# Patient Record
Sex: Male | Born: 1960
Health system: Southern US, Community
[De-identification: ages and names within clinical notes are randomized; demographics above are authoritative.]

## PROBLEM LIST (undated history)

## (undated) DIAGNOSIS — M5126 Other intervertebral disc displacement, lumbar region: Secondary | ICD-10-CM

## (undated) DIAGNOSIS — E7800 Pure hypercholesterolemia, unspecified: Secondary | ICD-10-CM

## (undated) DIAGNOSIS — I10 Essential (primary) hypertension: Secondary | ICD-10-CM

## (undated) DIAGNOSIS — N200 Calculus of kidney: Secondary | ICD-10-CM

## (undated) DIAGNOSIS — IMO0002 Reserved for concepts with insufficient information to code with codable children: Secondary | ICD-10-CM

## (undated) DIAGNOSIS — E119 Type 2 diabetes mellitus without complications: Secondary | ICD-10-CM

## (undated) HISTORY — PX: NECK SURGERY: SHX720

## (undated) HISTORY — PX: BACK SURGERY: SHX140

---

## 2003-05-06 ENCOUNTER — Encounter: Payer: Self-pay | Admitting: Neurosurgery

## 2003-05-08 ENCOUNTER — Encounter: Payer: Self-pay | Admitting: Neurosurgery

## 2003-05-08 ENCOUNTER — Ambulatory Visit (HOSPITAL_COMMUNITY): Admission: RE | Admit: 2003-05-08 | Discharge: 2003-05-09 | Payer: Self-pay | Admitting: Neurosurgery

## 2003-10-02 ENCOUNTER — Inpatient Hospital Stay (HOSPITAL_COMMUNITY): Admission: RE | Admit: 2003-10-02 | Discharge: 2003-10-03 | Payer: Self-pay | Admitting: Neurosurgery

## 2004-06-16 ENCOUNTER — Encounter: Admission: RE | Admit: 2004-06-16 | Discharge: 2004-06-16 | Payer: Self-pay | Admitting: Neurosurgery

## 2004-07-20 ENCOUNTER — Encounter: Admission: RE | Admit: 2004-07-20 | Discharge: 2004-07-20 | Payer: Self-pay | Admitting: Neurosurgery

## 2004-08-03 ENCOUNTER — Encounter: Admission: RE | Admit: 2004-08-03 | Discharge: 2004-08-03 | Payer: Self-pay | Admitting: Neurosurgery

## 2004-10-12 ENCOUNTER — Encounter: Admission: RE | Admit: 2004-10-12 | Discharge: 2004-10-12 | Payer: Self-pay | Admitting: Neurosurgery

## 2004-11-28 ENCOUNTER — Encounter (INDEPENDENT_AMBULATORY_CARE_PROVIDER_SITE_OTHER): Payer: Self-pay | Admitting: Specialist

## 2004-11-28 ENCOUNTER — Inpatient Hospital Stay (HOSPITAL_COMMUNITY): Admission: RE | Admit: 2004-11-28 | Discharge: 2004-12-02 | Payer: Self-pay | Admitting: Neurosurgery

## 2004-12-29 ENCOUNTER — Encounter: Admission: RE | Admit: 2004-12-29 | Discharge: 2004-12-29 | Payer: Self-pay | Admitting: Neurosurgery

## 2005-03-01 ENCOUNTER — Encounter: Admission: RE | Admit: 2005-03-01 | Discharge: 2005-03-01 | Payer: Self-pay | Admitting: Neurosurgery

## 2007-06-29 ENCOUNTER — Encounter: Admission: RE | Admit: 2007-06-29 | Discharge: 2007-06-29 | Payer: Self-pay | Admitting: Family Medicine

## 2007-07-29 ENCOUNTER — Ambulatory Visit (HOSPITAL_COMMUNITY): Admission: RE | Admit: 2007-07-29 | Discharge: 2007-07-30 | Payer: Self-pay | Admitting: Neurosurgery

## 2007-09-18 ENCOUNTER — Encounter: Admission: RE | Admit: 2007-09-18 | Discharge: 2007-09-18 | Payer: Self-pay | Admitting: Neurosurgery

## 2010-10-01 ENCOUNTER — Encounter: Payer: Self-pay | Admitting: Neurosurgery

## 2011-01-24 NOTE — Op Note (Signed)
NAME:  Stephen Moyer, Stephen Moyer                ACCOUNT NO.:  [id number]   MEDICAL RECORD NO.:  [id number]          PATIENT TYPE:  AMB   LOCATION:  SDS                          FACILITY:  [facility]   PHYSICIAN:  Henry A. Pool, M.D.    DATE OF BIRTH:  Sep 02, 1961   DATE OF PROCEDURE:  07/29/2007  DATE OF DISCHARGE:                               OPERATIVE REPORT   SERVICE:  Neurosurgery.   DIAGNOSIS:  C5-6 stenosis with myelopathy and radiculopathy.   POSTOPERATIVE DIAGNOSIS:  C5-6 stenosis with myelopathy and  radiculopathy.   PROCEDURE NAME:  C5-6 anterior cervical diskectomy and fusion with  allograft anterior plating.   SURGEON:  Kathaleen Maser. Pool, M.D.   ASSISTANT:  Donalee Citrin, M.D.   ANESTHESIA:  General endotracheal.   INDICATIONS:  Stephen Moyer is a 50 year old male with a history of neck  and right upper extremity pain, paresthesias and weakness consistent  with a right-sided C6 radiculopathy as well as some elements of an  underlying cervical myelopathy as well.  Workup has demonstrated  evidence of a central disk herniation at C5-6 with spinal cord  compression and exiting nerve root compression bilaterally.  The patient  has been counseled as to his options.  He has decided to proceed with a  C5-6 anterior cervical diskectomy and fusion with allograft and anterior  plating in hopes of improving his symptoms.   OPERATIVE NOTE:  The patient was taken to the operating room and placed  on the operating table in supine position.  After an adequate level of  anesthesia was achieved, the patient was positioned supine with his neck  slightly extended and held in place with halter traction.  The patient's  anterior cervical region is prepped and draped sterilely.  A 10 blade is  used to make a linear skin incision over the C5-6 interspace.  This is  carried down sharply to the platysma.  The platysma is then divided  vertically and dissection proceeds along the medial border of the  sternocleidomastoid muscle and carotid sheath.  Trachea and esophagus  are mobilized and retracted towards the left.  Prevertebral fascia is  stripped off the anterior spinal column.  The longus colli muscle is  elevated bilaterally using electrocautery.  A deep self-retaining  retractor was placed.  Intraoperative fluoroscopy was used.  The C5-6  level was confirmed.  Disk space at C5-6 was then incised with a 15  blade in a rectangular fashion.  A wide disk space clean-out was then  achieved using pituitary rongeurs, forward and backward-angled Carlen  curettes, Kerrison rongeurs, and a high-speed drill.  All elements of  the disk were removed down to the level of the posterior annulus.  A  microscope was then brought into the field and was used throughout the  remainder of the diskectomy and the remaining aspects of the annulus and  osteophytes were removed using a high-speed drill down to the level of  the posterior longitudinal ligament.  The posterior longitudinal  ligament was elevated and resected in piecemeal fashion using Kerrison  rongeurs.  The underlying thecal sac was  then identified.  A wide  central decompression was then performed by undercutting the bodies of  C5 and C6.  Decompression then proceeded out each neural foramen.  Wide  anterior foraminotomies were then performed along the course of the  exiting C6 nerve root bilaterally.  At this point a very thorough  decompression had been achieved.  There was no evidence of injury to the  thecal sac or nerve roots.  The wound was irrigated with antibiotic  solution.  Gelfoam was placed topically for hemostasis and found be  good.  A 6-mm LifeNet allograft wedge was then impacted into place,  recessed approximately 1 mm from the anterior cortical margin.  A 25-mm  Atlantis anterior cervical plate was then placed over the C5 and C6  levels.  This was then attached under fluoroscopic guidance using 13-mm  variable-angled  screws, two each at both levels.  All four screws were  given a final tightening and found to be solidly within bone.  The  locking screws were engaged at both levels.  Final images revealed good  position of bone grafts and hardware, proper operative level and normal  alignment of the spine.  The wound was then irrigated with antibiotic  solution.  Hemostasis was ensured with bipolar electrocautery.  The  wound was then closed in layers.  Steri-Strips and a sterile dressing  were applied.  There were no apparent complications.  The patient  tolerated the procedure well and he returns to the recovery room postop.           ______________________________  Kathaleen Maser. Pool, M.D.     HAP/MEDQ  D:  07/29/2007  T:  07/30/2007  Job:  [id number]

## 2011-01-27 NOTE — Op Note (Signed)
NAME:  Stephen Moyer                          ACCOUNT NO.:  [id number]   MEDICAL RECORD NO.:  [id number]                   PATIENT TYPE:  INP   LOCATION:  3012                                 FACILITY:  [facility]   PHYSICIAN:  Coletta Memos, M.D.                  DATE OF BIRTH:  [birth year]   DATE OF PROCEDURE:  10/02/2003  DATE OF DISCHARGE:                                 OPERATIVE REPORT   PREOPERATIVE DIAGNOSES:  1. Lumbar stenosis, L4-5, L3-4.  2. Possible disk recurrence L3-4.   POSTOPERATIVE DIAGNOSES:  1. Severe lumbar stenosis at L3-4, L4-5.  2. Scar formation L3-4.   SURGEON:  Coletta Memos, M.D.   ASSISTANT:  Hewitt Shorts, M.D.   PROCEDURE:  L3 hemilaminectomy, L4 laminectomy with microdissection  techniques.   COMPLICATIONS:  None.   FINDINGS:  1. Severe congenital stenosis of the spinal canal.  2. Exuberant scar formation around disk resection on the right at L3-4.  3. Very loose L3-4 joints, although the pars was not bruised, nor was the     facet capsule breached at either L4 or 4-5.   INDICATIONS:  Stephen Moyer is a 50 year old on whom I did an L3-4 disk  herniation.  He had lumbar radiculopathy.  Because he presented with  weakness in the lower extremities, I went ahead and he had his operation on  April 15, 2003, where he underwent a right L3-4 where he underwent a right  L3-4 semihemilaminectomy diskectomy.  Postoperatively he did well for nine  weeks.  In the tenth week he started to develop a little bit of pain in his  right leg again and that continued to progress.  New MRI showed what was  read as a recurrent disk although I disagreed.  I had a myelogram performed  and it did not show recurrent disk herniation but it did show pretty severe  stenosis at L3-4 and L4-5 and some sort of lump at the disk space.  I  therefore elected to take him back to the operating room for decompression.   OPERATIVE NOTE:  [name] was brought to the operating room,  intubated,  placed under general anesthesia without difficulty.  Rolled prone onto the  Wilson frame and all pressure points were properly padded.  The skin was  prepped and he was draped in a sterile fashion.  I infiltrated 20 mL 0.5%  lidocaine 1:200,000 strength epinephrine into the lumbar region using my old  incision as the guide.  I opened up the old incision and extended it  slightly caudally.  I then exposed the lamina of L3, L4 and L5.  I first  tried to do semihemilaminectomies, but the space was far too tight, and I  therefore elected to proceed with complete laminectomies of L4 and  hemilaminectomies of L3 and L5.  There was an opening in the dura made but  there  was no spinal fluid leakage.  The arachnoid was intact, and I just let  the scar which was overlying this remain in place.  Using the Kerrison  punches and the high speed air drill, I removed bone and did a decompression  of the L3-4 and L4-5 disk spaces.  Dr. Newell Coral assisted.   We brought the microscope into position and did not find a recurrent disk  herniation at L3-4 but did find exuberant scar formation in and around the  nerve roots.  There were clearish lumps and just significant scar tissue at  that level.  Disk material was removed, but the disk space was almost empty.  I controlled some bleeding in the epidural space with bipolar cautery.  I  also used some bone wax to control some bone bleeding.  I felt the patient  did have loose joints at L3-4 but the pars was intact and the joints were  intact.  We had not discussed a fusion and I did not feel it appropriate to  have him undergo fusion when 1) It was not entirely necessary, and 2) He did  not complain of back pain preoperatively nor at any other time.  His main  complaint was leg pain.   I then irrigated the wound.  I then closed the wound in layered fashion  using Vicryl sutures.  BioGlue was used to seal the scar tissue to the dura  overlying the  arachnoid.  Again no spinal fluid was ever encountered.  Sterile dressing was applied, being Steri-Strips and Telfa along with 4x4s.  The patient tolerated the procedure without difficulty.                                               Coletta Memos, M.D.    KC/MEDQ  D:  10/02/2003  T:  10/03/2003  Job:  [id number]

## 2011-01-27 NOTE — Op Note (Signed)
   NAME:  Stephen Moyer, Stephen Moyer                          ACCOUNT NO.:  [id number]   MEDICAL RECORD NO.:  [id number]                   PATIENT TYPE:  OIB   LOCATION:  2894                                 FACILITY:  [facility]   PHYSICIAN:  Coletta Memos, M.D.                  DATE OF BIRTH:  Jul 28, 1961   DATE OF PROCEDURE:  05/08/2003  DATE OF DISCHARGE:                                 OPERATIVE REPORT   PREOPERATIVE DIAGNOSIS:  Displaced disk, right L3-4, lumbar stenosis L3-4.   POSTOPERATIVE DIAGNOSIS:  Displaced disk, right L3-4, lumbar  stenosis L3-4,  L4 radiculopathy.   PROCEDURE:  1. L3 hemilaminectomy.  2. Right L3-4 diskectomy with microdissection.  3. Right L4 foraminotomy.   COMPLICATIONS:  None.   SURGEON:  Coletta Memos, M.D.   ANESTHESIA:  General endotracheal anesthesia.   INDICATIONS FOR PROCEDURE:  [name] is a 50 year old who presented  with weakness in his quadriceps muscle on the right side. Secondary to the  weakness on that side and MRI findings consistent with compression of the  nerve root, I recommended and he agreed to undergo a lumbar laminectomy and  diskectomy.   DESCRIPTION OF PROCEDURE:  [name] was brought to the operating room,  intubated and placed under general anesthesia without difficulty. His back  was prepped and he was draped in a sterile fashion. Using a preoperative  localizer, I opened the skin  and took this down to the thoracolumbar  fascia. I exposed the lamina of L3 and L4 on the right side.   After using a drill to perform a semi hemilaminectomy of L3, I felt that the  space was just too tight to try to proceed in that fashion. I then  exposed  the left lamina of L3 and L4 and proceeded with a hemilaminectomy of L3 and  L4.   After decompressing the spinal canal, I then proceeded to deal with the disk  herniation at L3-4. This was done with microdissection by retracting the L4  root medially. I then removed a significant amount  of disk material and  decompressed both the lateral aspect of the L3-4 neuroforamen and the  foraminotomy of L4 inferiorly.   I controlled some bleeding with bipolar cautery. I then irrigated the wound.  I then closed the wound in a layered fashion using Vicryl sutures. The skin  was reapproximated with Vicryl sutures. Dermabond was used for a sterile  dressing.                                                  Coletta Memos, M.D.    KC/MEDQ  D:  05/08/2003  T:  05/09/2003  Job:  [id number]

## 2011-01-27 NOTE — Op Note (Signed)
NAME:  Stephen Moyer, Stephen Moyer NO.:  [id number]   MEDICAL RECORD NO.:  [id number]          PATIENT TYPE:  INP   LOCATION:  2899                         FACILITY:  [facility]   PHYSICIAN:  Kathaleen Maser. Pool, M.D.    DATE OF BIRTH:  1961-04-04   DATE OF PROCEDURE:  11/28/2004  DATE OF DISCHARGE:                                 OPERATIVE REPORT   PREOPERATIVE DIAGNOSIS:  L3-4 degenerative disk disease with instability and  stenosis, status post previous L3-4 decompressive laminectomy.  L4-5  degenerative disk disease and stenosis, status post previous L4-5  decompressive laminectomy.  L3 post traumatic spondylolysis with  spondylolisthesis.   POSTOPERATIVE DIAGNOSES:  L3-4 degenerative disk disease with instability  and stenosis, status post previous L3-4 decompressive laminectomy.  L4-5  degenerative disk disease and stenosis, status post previous L4-5  decompressive laminectomy.  L3 post traumatic spondylolysis with  spondylolisthesis.   OPERATION PERFORMED:  L3 Gill procedure.  Re-exploration of L3-4 and L4-5  laminectomies with complete laminectomies and foraminotomies.  L3-4 and L4-5  posterior lumbar interbody fusion utilizing tangent wedges and local  autografting.  L3-4 and L4-5 posterolateral arthrodesis utilizing segmental  pedicle screw instrumentation and intertransverse bone grafting.   SURGEON:  Kathaleen Maser. Pool, M.D.   ASSISTANT:  Donalee Citrin, M.D.   ANESTHESIA:  General oral endotracheal.   INDICATIONS FOR PROCEDURE:  Stephen Moyer is a 50 year old male who is status  post previous L3-4 and L4-5 decompressive laminectomy with a right-sided L3-  4 microdiskectomy done on two different occasions.  The patient presents now  with severe back and left lower extremity pain consistent with a left-sided  L3 radiculopathy.  Work-up has demonstrated evidence of marked degenerative  disk disease with residual stenosis at L3-4 and L4-5.  The patient also has  evidence of  spondylosis with early spondylolisthesis at L3.  The patient has  marked reproduction of his symptoms with provocative diskography.  The  patient presents now for two-level decompression and fusion with  instrumentation.   DESCRIPTION OF PROCEDURE:  The patient was taken to the operating room and  placed on the table in the supine position.  After adequate level of  anesthesia was achieved, the patient was positioned prone onto a Wilson  frame and appropriately padded.  The patient's lumbar region was prepped and  draped sterilely.  A 10 blade was used to make a linear skin incision  overlying the L2, 3, 4, and 5  levels.  This was carried down sharply in the  midline.  A subperiosteal dissection was then performed exposing the lamina  and facet joints of L2 and L3 as well as the remaining facet joints at L3-4  and L4-5.  Transverse processes of L3, L4 and L5 were then dissected free.  Deep self-retaining retractor was placed.  Intraoperative fluoroscopy was  used and the levels were confirmed.  Residual lamina at L3 was removed using  the high speed drill and Kerrison rongeurs.  The patient's previous  decompressive laminectomy was dissected free and the surrounding epidural  scar was freed up.  Complete L3 laminectomy  was performed.  It should be  noted that patient had evidence of a post laminectomy spondylolysis of L3.  Complete inferior facetectomies at L3 were performed.  All bone was cleaned  and used in later autografting throughout.  Superior facetectomies at L4  were performed.  The underlying thecal sac and exiting L3-L4 nerve roots  were identified.  A wide decompressive foraminotomy was then performed along  the exiting nerve roots.  Decompression then proceeded inferiorly and  inferior facetectomies at L4 were then performed bilaterally as were  superior facetectomies at L5.  Once again, epidural scar was gently  dissected free.  In the midline, there was a large epidural  mass which was  black and quite thickened in nature.  This was causing severe dorsal  compression of the thecal sac.  This was dissected free.  This seemed  consistent with residual bioglue with a resultant foreign body reaction  around it.  There was no evidence of infection.  There was no evidence of  underlying CSF leak for which it was initially placed by the previous  surgeon.  The bioglue was sent off to pathology for analysis.  In the course  of freeing up the surrounding scar there was a small midline durotomy which  was made which was then oversewn with 5-0 Prolene.  Epidural venous plexus  was coagulated and cut.  Starting first on the patient's right side, the  thecal sac and nerve roots were protected at the L3-4 level.  The disk space  was then isolated and incised with a 15 blade in rectangular fashion.  Wide  disk space cleanout was then achieved using pituitary rongeurs, upward  angled pituitary rongeurs and Epstein curettes.  The procedure was then  repeated on the contralateral side at L3-4 and then bilaterally at L4-5.  Returning to L3-4 on the right side, thecal sac and nerve roots were once  again protected.  Disk space was then distracted up to 10 mm.  A 10 mm  distractor was left in the patient's right side.  Thecal sac and nerve roots  were protected on the left side.  Disk space was then reamed with a 10 mm  tangent box cutter and then cut with 10 mm tangent chisel.  Soft tissues  were removed from the interspace.  A 10 x 26 mm tangent wedge was then  impacted into place and recessed approximately 3 mm from the posterior  cortical margin.  Distractors were removed from the patient's right side.  Thecal sac and nerve roots were once again protected on the right side.  Once again the disk space was reamed and then cut with 10 mm tangent  instruments.  The disk space further curettaged.  Morcellized autograft was packed in the interspace.  A second 10 x 26 mm tangent  wedge was then packed  into place and recessed approximately 3 mm from the posterior cortical  margin.  The procedure was then repeated at the L4-5 level, again without  complication.  Again using 10 x 26 mm tangent wedges bilaterally.  Pedicles  of L3, L4 and L5 were then identified using surface landmarks and  intraoperative fluoroscopy.  Superficial bone overlying the pedicles were  then removed using a high speed drill.  Each pedicle was then probed, using  pedicle awl.  Each pedicle awl tract was then probed and found to be solidly  within bone.  Each pedicle awl tract was then tapped with 5.25 mm screw tap.  Once  again, these screw tap holes were probed and found to be solidly within  bone.  6.75 x 50 mm Spiral 90 screws placed bilaterally at L3, 6.75 x 45 mm  Spiral 90 screws were placed bilaterally at L4 and L5.  All six screws had  good purchase and were well positioned.  Transverse processes of L3, 4 and 5  were then decorticated using a high speed drill.  Morcellized autograft was  packed posterolaterally for intertransverse fusion.  Short segment of  titanium rod was then contoured and placed over the screw heads at L3, 4,  and 5.  Locking caps were then placed over the screw heads.  Lockings caps  were then engaged with the construct under compression.  A transverse  connector was placed.  Duragen and Tisseeal fibrin glue and sealant was then  placed over the dural repair.  Gelfoam was also placed within the gutters.  A medium Hemovac drain was left in the epidural space.  The wound was then  closed in layers with Vicryl sutures.  Steri-Strips and sterile dressing  were applied.  There were no apparent complications.  The patient tolerated  the procedure well and he returned to the recovery room postoperatively.      HAP/MEDQ  D:  11/28/2004  T:  11/28/2004  Job:  [id number]

## 2011-01-27 NOTE — Discharge Summary (Signed)
NAME:  Stephen Moyer, Stephen Moyer                          ACCOUNT NO.:  [id number]   MEDICAL RECORD NO.:  [id number]                   PATIENT TYPE:  INP   LOCATION:  3012                                 FACILITY:  [facility]   PHYSICIAN:  Coletta Memos, M.D.                  DATE OF BIRTH:  21-Sep-1960   DATE OF ADMISSION:  10/02/2003  DATE OF DISCHARGE:  10/03/2003                                 DISCHARGE SUMMARY   ADMISSION DIAGNOSES:  Lumbar stenosis L3-4, L4-5, possible recurrent disk L3-  4.   DISCHARGE DIAGNOSES:  Lumbar stenosis L4-5, L3-L4. Exuberant scar formation  though no disk recurrence identified.   DISPOSITION:  Discharge destination home.  Discharge status alive and well.  The wound was clean and dry without signs of infection at discharge. Normal  strength  in the lower extremities.   DISCHARGE MEDICATIONS:  1. Percocet 5 mg 1 to 2 q.6h., #60 given.  2. Flexeril 1 p.o. t.i.d. p.r.n. muscle spasms, 10 mg.   DISCHARGE INSTRUCTIONS:  At discharge the patient was given instructions to  wear a brace, Aspen type when up and out of bed for approximately 1 month.  He was also given instructions to follow up with my secretary to make an  appointment at my office in  3 to 4 weeks.   HOSPITAL COURSE:  The patient was admitted secondary to the questionable  possibility of a recurrent disk herniation at L3-4. She was brought to the  operating room and underwent  a lumbar laminectomy decompression at L3-4 and  L4-5 and exploration of disk space of L3-4 on the right side. He did not  have a recurrent disk but had a significant amount of scar formation.   Postoperatively he was doing well. He voided. He tolerated a regular diet  and has a normal neurologic examination.                                                Coletta Memos, M.D.    KC/MEDQ  D:  10/02/2003  T:  10/03/2003  Job:  [id number]

## 2011-01-27 NOTE — Discharge Summary (Signed)
NAME:  Stephen Moyer, Stephen Moyer NO.:  [id number]   MEDICAL RECORD NO.:  [id number]          PATIENT TYPE:  INP   LOCATION:  3036                         FACILITY:  [facility]   PHYSICIAN:  Kathaleen Maser. Pool, M.D.    DATE OF BIRTH:  Jul 23, 1961   DATE OF ADMISSION:  11/28/2004  DATE OF DISCHARGE:  12/02/2004                                 DISCHARGE SUMMARY   FINAL DIAGNOSIS:  L3-4 and L4-5 degenerative disc disease with stenosis and  instability.   POSTOPERATIVE DIAGNOSIS:  L3-4 and L4-5 degenerative disc disease with  stenosis and instability.   PROCEDURE:  L3-4 and L4-5 decompression and fusion with instrumentation.   HISTORY OF PRESENT ILLNESS:  [name] is a 50 year old male who is status  post previous L3-4 and L4-5 laminectomy by another physician.  Work-up  demonstrates evidence of progressed instability and stenosis.  Patient has  evidence of a significant spondylolysis at L3.  He presents now for L3-4 and  L4-5 decompression and fusion.   HOSPITAL COURSE:  Patient taken to the operating room where an uncomplicated  L3-4 and L40-5 decompression and fusion with instrumentation was performed.  Patient was gradually mobilized with good results.  His lower extremity pain  resolved.  Back pain was much improved.  At the time of discharge, patient's  wound is healing well.  He is having no radicular pain.  He is requiring  virtually no pain medication.   CONDITION ON DISCHARGE:  Improved.   DISPOSITION:  Patient will be discharged home.  He will follow up in my  office in one week.      HAP/MEDQ  D:  01/31/2005  T:  01/31/2005  Job:  [id number]

## 2011-02-24 ENCOUNTER — Inpatient Hospital Stay (HOSPITAL_COMMUNITY): Admission: RE | Admit: 2011-02-24 | Payer: Medicaid Other | Source: Ambulatory Visit

## 2011-06-20 LAB — CBC
HCT: 46.1
Hemoglobin: 15.5
MCHC: 33.7
MCV: 86.2
Platelets: 156
RBC: 5.34
RDW: 13.5
WBC: 5.6

## 2011-06-20 LAB — DIFFERENTIAL
Basophils Absolute: 0
Basophils Relative: 1
Eosinophils Absolute: 0.1 — ABNORMAL LOW
Eosinophils Relative: 2
Lymphocytes Relative: 31
Lymphs Abs: 1.7
Monocytes Absolute: 0.4
Monocytes Relative: 7
Neutro Abs: 3.3
Neutrophils Relative %: 60

## 2011-06-20 LAB — BASIC METABOLIC PANEL WITH GFR
BUN: 12
CO2: 28
Calcium: 9.4
Chloride: 101
Creatinine, Ser: 0.93
GFR calc Af Amer: >60
GFR calc non Af Amer: >60
Glucose, Bld: 156 — ABNORMAL HIGH
Potassium: 4.6
Sodium: 138

## 2011-06-20 LAB — TYPE AND SCREEN
ABO/RH(D): O NEG
Antibody Screen: NEGATIVE

## 2011-06-20 LAB — ABO/RH: ABO/RH(D): O NEG

## 2012-07-21 ENCOUNTER — Emergency Department (HOSPITAL_BASED_OUTPATIENT_CLINIC_OR_DEPARTMENT_OTHER): Payer: BC Managed Care – PPO

## 2012-07-21 ENCOUNTER — Emergency Department (HOSPITAL_BASED_OUTPATIENT_CLINIC_OR_DEPARTMENT_OTHER)
Admission: EM | Admit: 2012-07-21 | Discharge: 2012-07-21 | Disposition: A | Discharge status: Home / Self Care | Payer: BC Managed Care – PPO | Attending: Emergency Medicine | Admitting: Emergency Medicine

## 2012-07-21 ENCOUNTER — Encounter (HOSPITAL_BASED_OUTPATIENT_CLINIC_OR_DEPARTMENT_OTHER): Payer: Self-pay | Admitting: Emergency Medicine

## 2012-07-21 DIAGNOSIS — Z79899 Other long term (current) drug therapy: Secondary | ICD-10-CM | POA: Insufficient documentation

## 2012-07-21 DIAGNOSIS — N23 Unspecified renal colic: Secondary | ICD-10-CM | POA: Insufficient documentation

## 2012-07-21 DIAGNOSIS — E7800 Pure hypercholesterolemia, unspecified: Secondary | ICD-10-CM | POA: Insufficient documentation

## 2012-07-21 DIAGNOSIS — R110 Nausea: Secondary | ICD-10-CM | POA: Insufficient documentation

## 2012-07-21 DIAGNOSIS — R319 Hematuria, unspecified: Secondary | ICD-10-CM | POA: Insufficient documentation

## 2012-07-21 DIAGNOSIS — R1032 Left lower quadrant pain: Secondary | ICD-10-CM | POA: Insufficient documentation

## 2012-07-21 DIAGNOSIS — I10 Essential (primary) hypertension: Secondary | ICD-10-CM | POA: Insufficient documentation

## 2012-07-21 DIAGNOSIS — Z87442 Personal history of urinary calculi: Secondary | ICD-10-CM | POA: Insufficient documentation

## 2012-07-21 DIAGNOSIS — E119 Type 2 diabetes mellitus without complications: Secondary | ICD-10-CM | POA: Insufficient documentation

## 2012-07-21 HISTORY — DX: Pure hypercholesterolemia, unspecified: E78.00

## 2012-07-21 HISTORY — DX: Essential (primary) hypertension: I10

## 2012-07-21 HISTORY — DX: Other intervertebral disc displacement, lumbar region: M51.26

## 2012-07-21 HISTORY — DX: Calculus of kidney: N20.0

## 2012-07-21 HISTORY — DX: Type 2 diabetes mellitus without complications: E11.9

## 2012-07-21 HISTORY — DX: Reserved for concepts with insufficient information to code with codable children: IMO0002

## 2012-07-21 LAB — CBC WITH DIFFERENTIAL/PLATELET
Basophils Absolute: 0 K/uL (ref 0.0–0.1)
Basophils Relative: 0 % (ref 0–1)
Eosinophils Absolute: 0.1 K/uL (ref 0.0–0.7)
Eosinophils Relative: 1 % (ref 0–5)
HCT: 41.8 % (ref 39.0–52.0)
Hemoglobin: 14.1 g/dL (ref 13.0–17.0)
Lymphocytes Relative: 12 % (ref 12–46)
Lymphs Abs: 1.3 K/uL (ref 0.7–4.0)
MCH: 28.8 pg (ref 26.0–34.0)
MCHC: 33.7 g/dL (ref 30.0–36.0)
MCV: 85.5 fL (ref 78.0–100.0)
Monocytes Absolute: 0.9 K/uL (ref 0.1–1.0)
Monocytes Relative: 8 % (ref 3–12)
Neutro Abs: 8.9 K/uL — ABNORMAL HIGH (ref 1.7–7.7)
Neutrophils Relative %: 80 % — ABNORMAL HIGH (ref 43–77)
Platelets: 164 K/uL (ref 150–400)
RBC: 4.89 MIL/uL (ref 4.22–5.81)
RDW: 13.2 % (ref 11.5–15.5)
WBC: 11.1 K/uL — ABNORMAL HIGH (ref 4.0–10.5)

## 2012-07-21 LAB — URINALYSIS, ROUTINE W REFLEX MICROSCOPIC
Bilirubin Urine: NEGATIVE
Glucose, UA: >1000 mg/dL — AB
Hgb urine dipstick: NEGATIVE
Ketones, ur: 15 mg/dL — AB
Leukocytes, UA: NEGATIVE
Nitrite: NEGATIVE
Protein, ur: NEGATIVE mg/dL
Specific Gravity, Urine: 1.034 — ABNORMAL HIGH (ref 1.005–1.030)
Urobilinogen, UA: 0.2 mg/dL (ref 0.0–1.0)
pH: 5.5 (ref 5.0–8.0)

## 2012-07-21 LAB — COMPREHENSIVE METABOLIC PANEL WITH GFR
ALT: 25 U/L (ref 0–53)
AST: 20 U/L (ref 0–37)
Albumin: 4.3 g/dL (ref 3.5–5.2)
Alkaline Phosphatase: 88 U/L (ref 39–117)
BUN: 25 mg/dL — ABNORMAL HIGH (ref 6–23)
CO2: 24 meq/L (ref 19–32)
Calcium: 9.5 mg/dL (ref 8.4–10.5)
Chloride: 95 meq/L — ABNORMAL LOW (ref 96–112)
Creatinine, Ser: 1.5 mg/dL — ABNORMAL HIGH (ref 0.50–1.35)
GFR calc Af Amer: 61 mL/min — ABNORMAL LOW (ref >90)
GFR calc non Af Amer: 52 mL/min — ABNORMAL LOW (ref >90)
Glucose, Bld: 231 mg/dL — ABNORMAL HIGH (ref 70–99)
Potassium: 3.9 meq/L (ref 3.5–5.1)
Sodium: 134 meq/L — ABNORMAL LOW (ref 135–145)
Total Bilirubin: 0.6 mg/dL (ref 0.3–1.2)
Total Protein: 7.5 g/dL (ref 6.0–8.3)

## 2012-07-21 LAB — URINE MICROSCOPIC-ADD ON

## 2012-07-21 MED ORDER — ONDANSETRON HCL 4 MG/2ML IJ SOLN
4.0000 mg | Freq: Once | INTRAMUSCULAR | Status: AC
  Administered 2012-07-21: 4 mg via INTRAVENOUS
  Filled 2012-07-21: qty 2

## 2012-07-21 MED ORDER — HYDROMORPHONE HCL PF 1 MG/ML IJ SOLN
1.0000 mg | Freq: Once | INTRAMUSCULAR | Status: AC
  Administered 2012-07-21: 1 mg via INTRAVENOUS
  Filled 2012-07-21: qty 1

## 2012-07-21 MED ORDER — SODIUM CHLORIDE 0.9 % IV BOLUS (SEPSIS)
1000.0000 mL | Freq: Once | INTRAVENOUS | Status: AC
  Administered 2012-07-21: 1000 mL via INTRAVENOUS

## 2012-07-21 MED ORDER — MORPHINE SULFATE 4 MG/ML IJ SOLN
4.0000 mg | Freq: Once | INTRAMUSCULAR | Status: DC
  Administered 2012-07-21: 4 mg via INTRAVENOUS
  Filled 2012-07-21: qty 1

## 2012-07-21 MED ORDER — KETOROLAC TROMETHAMINE 30 MG/ML IJ SOLN
30.0000 mg | Freq: Once | INTRAMUSCULAR | Status: AC
  Administered 2012-07-21: 30 mg via INTRAVENOUS
  Filled 2012-07-21: qty 1

## 2012-07-21 MED ORDER — ONDANSETRON 4 MG PO TBDP: 4.0000 mg | ORAL_TABLET | Freq: Three times a day (TID) | ORAL | Status: DC | PRN

## 2012-07-21 MED ORDER — IBUPROFEN 600 MG PO TABS: 600.0000 mg | ORAL_TABLET | Freq: Four times a day (QID) | ORAL | Status: DC | PRN

## 2012-07-21 MED ORDER — ONDANSETRON HCL 4 MG/2ML IJ SOLN
INTRAMUSCULAR | Status: AC
  Administered 2012-07-21: 4 mg via INTRAVENOUS
  Filled 2012-07-21: qty 2

## 2012-07-21 MED ORDER — ONDANSETRON HCL 4 MG/2ML IJ SOLN
4.0000 mg | Freq: Once | INTRAMUSCULAR | Status: AC
  Administered 2012-07-21: 4 mg via INTRAVENOUS

## 2012-07-21 NOTE — ED Provider Notes (Signed)
History     CSN: [id number]  Arrival date & time 07/21/12  4098   First MD Initiated Contact with Patient 07/21/12 [phone]      Chief Complaint  Patient presents with  . Flank Pain    (Consider location/radiation/quality/duration/timing/severity/associated sxs/prior treatment) HPI Pt with previous history of renal stones presents with similar pain starting on Thursday. +L flank pain radiating to LLQ. + nausea. +hematuria. No fever or chills. Seen by PMD and started on percocet and flomax. Pain is uncontrolled. Advised to come to the ED for evaluation.  Past Medical History  Diagnosis Date  . Kidney stone   . Hypertension   . Diabetes mellitus without complication   . Hypercholesteremia   . Ruptured lumbar disc   . Degenerative disk disease     Past Surgical History  Procedure Date  . Back surgery   . Neck surgery     No family history on file.  History  Substance Use Topics  . Smoking status: Never Smoker   . Smokeless tobacco: Not on file  . Alcohol Use: No      Review of Systems  Constitutional: Negative for fever and chills.  Respiratory: Negative for shortness of breath.   Cardiovascular: Negative for chest pain.  Gastrointestinal: Positive for nausea and abdominal pain. Negative for vomiting, diarrhea and constipation.  Genitourinary: Positive for hematuria and flank pain. Negative for testicular pain.  Musculoskeletal: Positive for back pain.  Skin: Negative for rash and wound.  Neurological: Negative for dizziness, weakness, light-headedness, numbness and headaches.    Allergies  Review of patient's allergies indicates no known allergies.  Home Medications   Current Outpatient Rx  Name  Route  Sig  Dispense  Refill  . GLIPIZIDE 10 MG PO TABS   Oral   Take 10 mg by mouth daily.         Marland Kitchen HYDROCHLOROTHIAZIDE 12.5 MG PO CAPS   Oral   Take 12.5 mg by mouth daily.         Marland Kitchen METFORMIN HCL 1000 MG PO TABS   Oral   Take 1,000 mg by mouth 2  (two) times daily with a meal.         . OXYCODONE-ACETAMINOPHEN 10-325 MG PO TABS   Oral   Take 1 tablet by mouth every 4 (four) hours as needed.         Marland Kitchen PIOGLITAZONE HCL 45 MG PO TABS   Oral   Take 45 mg by mouth daily.         . QUINAPRIL HCL 40 MG PO TABS   Oral   Take 40 mg by mouth at bedtime.         Marland Kitchen SIMVASTATIN 80 MG PO TABS   Oral   Take 80 mg by mouth at bedtime.         . TAMSULOSIN HCL 0.4 MG PO CAPS   Oral   Take 0.4 mg by mouth.         . IBUPROFEN 600 MG PO TABS   Oral   Take 1 tablet (600 mg total) by mouth every 6 (six) hours as needed for pain.   30 tablet   0   . ONDANSETRON 4 MG PO TBDP   Oral   Take 1 tablet (4 mg total) by mouth every 8 (eight) hours as needed for nausea.   20 tablet   0     BP 130/65  Pulse 75  Temp 98.3 F (36.8 C) (Oral)  Resp 16  SpO2 99%  Physical Exam  Nursing note and vitals reviewed. Constitutional: He is oriented to person, place, and time. He appears well-developed and well-nourished. No distress.  HENT:  Head: Normocephalic and atraumatic.  Mouth/Throat: Oropharynx is clear and moist.  Eyes: EOM are normal. Pupils are equal, round, and reactive to light.  Neck: Normal range of motion. Neck supple.  Cardiovascular: Normal rate and regular rhythm.   Pulmonary/Chest: Effort normal and breath sounds normal. No respiratory distress. He has no wheezes. He has no rales.  Abdominal: Soft. Bowel sounds are normal. He exhibits no distension and no mass. There is no tenderness. There is no rebound and no guarding.  Musculoskeletal: Normal range of motion. He exhibits tenderness (Tenderness to percussion over L kidney). He exhibits no edema.  Neurological: He is alert and oriented to person, place, and time.       5/5 motor in all ext, ambulating without difficulty  Skin: Skin is warm and dry. No rash noted. No erythema.  Psychiatric: He has a normal mood and affect. His behavior is normal.    ED Course   Procedures (including critical care time)  Labs Reviewed  URINALYSIS, ROUTINE W REFLEX MICROSCOPIC - Abnormal; Notable for the following:    Specific Gravity, Urine 1.034 (*)     Glucose, UA >1000 (*)     Ketones, ur 15 (*)     All other components within normal limits  CBC WITH DIFFERENTIAL - Abnormal; Notable for the following:    WBC 11.1 (*)     Neutrophils Relative 80 (*)     Neutro Abs 8.9 (*)     All other components within normal limits  COMPREHENSIVE METABOLIC PANEL - Abnormal; Notable for the following:    Sodium 134 (*)     Chloride 95 (*)     Glucose, Bld 231 (*)     BUN 25 (*)     Creatinine, Ser 1.50 (*)     GFR calc non Af Amer 52 (*)     GFR calc Af Amer 61 (*)     All other components within normal limits  URINE MICROSCOPIC-ADD ON - Abnormal; Notable for the following:    Bacteria, UA FEW (*)     All other components within normal limits   Ct Abdomen Pelvis Wo Contrast  07/21/2012  *RADIOLOGY REPORT*  Clinical Data: Left flank pain.  Suspect renal colic.  History of stones and hypertension.  History of back surgery.  CT ABDOMEN AND PELVIS WITHOUT CONTRAST  Technique:  Multidetector CT imaging of the abdomen and pelvis was performed following the standard protocol without intravenous contrast.  Comparison: None  Findings: Images of the lung bases are unremarkable.  There is significant left hydronephrosis and perinephric stranding. There is dilatation of the proximal ureter secondary to a left ureteral vesicle junction calculus which measures 3 mm in diameter. No intrarenal calculi are identified.  Have a normal appearance.  No focal abnormality identified within the liver, spleen, pancreas, adrenal glands, or right kidney.  The gallbladder is present.  The stomach and small bowel loops have a normal appearance. The appendix is well seen and has a normal appearance.  Colonic loops are normal in wall thickness and caliber.  There is no free pelvic fluid or pelvic  adenopathy.  The patient has had prior posterior fusion of the lumbar spine, levels L3-L5.  IMPRESSION:  1.  Left ureteral vesicle junction calculus measures 3 mm. 2.  Associated left hydroureter  and hydronephrosis. 3.  Normal appendix.  No evidence for bowel obstruction.   Original Report Authenticated By: Norva Pavlov, M.D.      1. Renal colic on left side       MDM   Pt states the pain is gone. Will add NSAID and antiemetic and refer to urologist. Pt given strict return to ED precautions.        Loren Racer, MD 07/21/12 1112

## 2012-07-21 NOTE — Discharge Instructions (Signed)
Ureteral Colic (Kidney Stones) Ureteral colic is the result of a condition when kidney stones form inside the kidney. Once kidney stones are formed they may move into the tube that connects the kidney with the bladder (ureter). If this occurs, this condition may cause pain (colic) in the ureter.  CAUSES  Pain is caused by stone movement in the ureter and the obstruction caused by the stone. SYMPTOMS  The pain comes and goes as the ureter contracts around the stone. The pain is usually intense, sharp, and stabbing in character. The location of the pain may move as the stone moves through the ureter. When the stone is near the kidney the pain is usually located in the back and radiates to the belly (abdomen). When the stone is ready to pass into the bladder the pain is often located in the lower abdomen on the side the stone is located. At this location, the symptoms may mimic those of a urinary tract infection with urinary frequency. Once the stone is located here it often passes into the bladder and the pain disappears completely. TREATMENT   Your caregiver will provide you with medicine for pain relief.  You may require specialized follow-up X-rays.  The absence of pain does not always mean that the stone has passed. It may have just stopped moving. If the urine remains completely obstructed, it can cause loss of kidney function or even complete destruction of the involved kidney. It is your responsibility and in your interest that X-rays and follow-ups as suggested by your caregiver are completed. Relief of pain without passage of the stone can be associated with severe damage to the kidney, including loss of kidney function on that side.  If your stone does not pass on its own, additional measures may be taken by your caregiver to ensure its removal. HOME CARE INSTRUCTIONS   Increase your fluid intake. Water is the preferred fluid since juices containing vitamin C may acidify the urine making it  less likely for certain stones (uric acid stones) to pass.  Strain all urine. A strainer will be provided. Keep all particulate matter or stones for your caregiver to inspect.  Take your pain medicine as directed.  Make a follow-up appointment with your caregiver as directed.  Remember that the goal is passage of your stone. The absence of pain does not mean the stone is gone. Follow your caregiver's instructions.  Only take over-the-counter or prescription medicines for pain, discomfort, or fever as directed by your caregiver. SEEK MEDICAL CARE IF:   Pain cannot be controlled with the prescribed medicine.  You have a fever.  Pain continues for longer than your caregiver advises it should.  There is a change in the pain, and you develop chest discomfort or constant abdominal pain.  You feel faint or pass out. MAKE SURE YOU:   Understand these instructions.  Will watch your condition.  Will get help right away if you are not doing well or get worse. Document Released: 06/07/2005 Document Revised: 11/20/2011 Document Reviewed: 02/22/2011 [employer] Patient Information 2013 [employer].  

## 2012-07-21 NOTE — ED Notes (Signed)
Pt SPo2 reading low on monitor.  Went into pts room.  Pt nodding off/on Placed on O2 2l [place]

## 2012-07-21 NOTE — ED Notes (Signed)
Pt c/o nausea since returning from CT.  Informed MD, orders obtained.

## 2013-03-17 ENCOUNTER — Encounter (HOSPITAL_BASED_OUTPATIENT_CLINIC_OR_DEPARTMENT_OTHER): Payer: Self-pay | Admitting: *Deleted

## 2013-03-17 ENCOUNTER — Emergency Department (HOSPITAL_BASED_OUTPATIENT_CLINIC_OR_DEPARTMENT_OTHER): Payer: BC Managed Care – PPO

## 2013-03-17 ENCOUNTER — Emergency Department (HOSPITAL_BASED_OUTPATIENT_CLINIC_OR_DEPARTMENT_OTHER)
Admission: EM | Admit: 2013-03-17 | Discharge: 2013-03-17 | Disposition: A | Discharge status: Home / Self Care | Payer: BC Managed Care – PPO | Attending: Emergency Medicine | Admitting: Emergency Medicine

## 2013-03-17 DIAGNOSIS — E7800 Pure hypercholesterolemia, unspecified: Secondary | ICD-10-CM | POA: Insufficient documentation

## 2013-03-17 DIAGNOSIS — N201 Calculus of ureter: Secondary | ICD-10-CM | POA: Insufficient documentation

## 2013-03-17 DIAGNOSIS — E119 Type 2 diabetes mellitus without complications: Secondary | ICD-10-CM | POA: Insufficient documentation

## 2013-03-17 DIAGNOSIS — Z8739 Personal history of other diseases of the musculoskeletal system and connective tissue: Secondary | ICD-10-CM | POA: Insufficient documentation

## 2013-03-17 DIAGNOSIS — Z79899 Other long term (current) drug therapy: Secondary | ICD-10-CM | POA: Insufficient documentation

## 2013-03-17 DIAGNOSIS — Z87442 Personal history of urinary calculi: Secondary | ICD-10-CM | POA: Insufficient documentation

## 2013-03-17 DIAGNOSIS — I10 Essential (primary) hypertension: Secondary | ICD-10-CM | POA: Insufficient documentation

## 2013-03-17 DIAGNOSIS — R112 Nausea with vomiting, unspecified: Secondary | ICD-10-CM | POA: Insufficient documentation

## 2013-03-17 LAB — CBC
HCT: 39.6 % (ref 39.0–52.0)
Hemoglobin: 13.9 g/dL (ref 13.0–17.0)
MCH: 30 pg (ref 26.0–34.0)
MCHC: 35.1 g/dL (ref 30.0–36.0)
MCV: 85.3 fL (ref 78.0–100.0)
Platelets: 162 K/uL (ref 150–400)
RBC: 4.64 MIL/uL (ref 4.22–5.81)
RDW: 13.2 % (ref 11.5–15.5)
WBC: 10.3 K/uL (ref 4.0–10.5)

## 2013-03-17 LAB — URINALYSIS, ROUTINE W REFLEX MICROSCOPIC
Bilirubin Urine: NEGATIVE
Glucose, UA: >1000 mg/dL — AB
Ketones, ur: NEGATIVE mg/dL
Leukocytes, UA: NEGATIVE
Nitrite: NEGATIVE
Protein, ur: 30 mg/dL — AB
Specific Gravity, Urine: 1.023 (ref 1.005–1.030)
Urobilinogen, UA: 0.2 mg/dL (ref 0.0–1.0)
pH: 5.5 (ref 5.0–8.0)

## 2013-03-17 LAB — URINE MICROSCOPIC-ADD ON

## 2013-03-17 LAB — BASIC METABOLIC PANEL WITH GFR
BUN: 27 mg/dL — ABNORMAL HIGH (ref 6–23)
CO2: 22 meq/L (ref 19–32)
Calcium: 10.1 mg/dL (ref 8.4–10.5)
Chloride: 98 meq/L (ref 96–112)
Creatinine, Ser: 1.6 mg/dL — ABNORMAL HIGH (ref 0.50–1.35)
GFR calc Af Amer: 56 mL/min — ABNORMAL LOW (ref >90)
GFR calc non Af Amer: 48 mL/min — ABNORMAL LOW (ref >90)
Glucose, Bld: 219 mg/dL — ABNORMAL HIGH (ref 70–99)
Potassium: 3.9 meq/L (ref 3.5–5.1)
Sodium: 136 meq/L (ref 135–145)

## 2013-03-17 MED ORDER — HYDROMORPHONE HCL PF 1 MG/ML IJ SOLN
1.0000 mg | Freq: Once | INTRAMUSCULAR | Status: AC
  Administered 2013-03-17: 1 mg via INTRAVENOUS
  Filled 2013-03-17: qty 1

## 2013-03-17 MED ORDER — TAMSULOSIN HCL 0.4 MG PO CAPS: 0.4000 mg | ORAL_CAPSULE | Freq: Every day | ORAL | Status: DC

## 2013-03-17 MED ORDER — ONDANSETRON HCL 4 MG PO TABS: 4.0000 mg | ORAL_TABLET | Freq: Four times a day (QID) | ORAL | Status: DC

## 2013-03-17 MED ORDER — ONDANSETRON HCL 4 MG/2ML IJ SOLN
4.0000 mg | Freq: Once | INTRAMUSCULAR | Status: AC
  Administered 2013-03-17: 4 mg via INTRAVENOUS
  Filled 2013-03-17: qty 2

## 2013-03-17 MED ORDER — OXYCODONE-ACETAMINOPHEN 5-325 MG PO TABS: 1.0000 | ORAL_TABLET | Freq: Four times a day (QID) | ORAL | Status: DC | PRN

## 2013-03-17 NOTE — Discharge Instructions (Signed)
Return to the ED with any concerns including fever, vomiting and not able to keep down liquids, worsening abdominal pain, decreased level of alertness/lethargy, or any other alarming symptoms

## 2013-03-17 NOTE — ED Notes (Signed)
Left flank pain on and off x 2 days. Constant since yesterday. Oxycodone takes the edge off a little. Hx of kidney stones.

## 2013-03-17 NOTE — ED Notes (Signed)
MD at bedside. 

## 2013-03-17 NOTE — ED Notes (Signed)
Patient transported to CT 

## 2013-03-17 NOTE — ED Notes (Signed)
Family at bedside. 

## 2013-03-17 NOTE — ED Provider Notes (Signed)
History  This chart was scribed for Ethelda Chick, MD by Ardeen Jourdain, ED Scribe. This patient was seen in room MH12/MH12 and the patient's care was started at 2132.  CSN: [id number] Arrival date & time 03/17/13  2031  First MD Initiated Contact with Patient 03/17/13 2132     Chief Complaint  Patient presents with  . Flank Pain    Patient is a 52 y.o. male presenting with flank pain. The history is provided by the patient. No language interpreter was used.  Flank Pain This is a new problem. The current episode started 2 days ago. The problem occurs constantly. The problem has been gradually worsening. Pertinent negatives include no chest pain, no abdominal pain, no headaches and no shortness of breath.    HPI Comments: Stephen Moyer is a 52 y.o. male who presents to the Emergency Department complaining of gradual onset, gradually worsening, constant left flank pain that began 2 days ago. Pt has associated nausea and emesis. Pt states the pain became constant yesterday. He states the pain began in the upper right side and has migrated lower. He states he has a h/o kidney stone on his left side with similar symptoms. Pt reports using oxycodone with slight relief.   Past Medical History  Diagnosis Date  . Kidney stone   . Hypertension   . Diabetes mellitus without complication   . Hypercholesteremia   . Ruptured lumbar disc   . Degenerative disk disease    Past Surgical History  Procedure Laterality Date  . Back surgery    . Neck surgery     No family history on file. History  Substance Use Topics  . Smoking status: Never Smoker   . Smokeless tobacco: Not on file  . Alcohol Use: No    Review of Systems  Respiratory: Negative for shortness of breath.   Cardiovascular: Negative for chest pain.  Gastrointestinal: Negative for abdominal pain.  Genitourinary: Positive for flank pain.  Neurological: Negative for headaches.  All other systems reviewed and are  negative.    Allergies  Review of patient's allergies indicates no known allergies.  Home Medications   Current Outpatient Rx  Name  Route  Sig  Dispense  Refill  . glipiZIDE (GLUCOTROL) 10 MG tablet   Oral   Take 10 mg by mouth daily.         . hydrochlorothiazide (MICROZIDE) 12.5 MG capsule   Oral   Take 12.5 mg by mouth daily.         Marland Kitchen ibuprofen (ADVIL,MOTRIN) 600 MG tablet   Oral   Take 1 tablet (600 mg total) by mouth every 6 (six) hours as needed for pain.   30 tablet   0   . metFORMIN (GLUCOPHAGE) 1000 MG tablet   Oral   Take 1,000 mg by mouth 2 (two) times daily with a meal.         . ondansetron (ZOFRAN ODT) 4 MG disintegrating tablet   Oral   Take 1 tablet (4 mg total) by mouth every 8 (eight) hours as needed for nausea.   20 tablet   0   . ondansetron (ZOFRAN) 4 MG tablet   Oral   Take 1 tablet (4 mg total) by mouth every 6 (six) hours.   12 tablet   0   . oxyCODONE-acetaminophen (PERCOCET) 10-325 MG per tablet   Oral   Take 1 tablet by mouth every 4 (four) hours as needed.         Marland Kitchen  oxyCODONE-acetaminophen (PERCOCET/ROXICET) 5-325 MG per tablet   Oral   Take 1-2 tablets by mouth every 6 (six) hours as needed for pain.   15 tablet   0   . pioglitazone (ACTOS) 45 MG tablet   Oral   Take 45 mg by mouth daily.         . quinapril (ACCUPRIL) 40 MG tablet   Oral   Take 40 mg by mouth at bedtime.         . simvastatin (ZOCOR) 80 MG tablet   Oral   Take 80 mg by mouth at bedtime.         . tamsulosin (FLOMAX) 0.4 MG CAPS   Oral   Take 1 capsule (0.4 mg total) by mouth daily.   14 capsule   0   . Tamsulosin HCl (FLOMAX) 0.4 MG CAPS   Oral   Take 0.4 mg by mouth.          Triage Vitals: BP 150/76  Pulse 88  Temp(Src) 98.3 F (36.8 C) (Oral)  Resp 20  Wt 280 lb (127.007 kg)  SpO2 100%  Physical Exam  Nursing note and vitals reviewed. Constitutional: He is oriented to person, place, and time. He appears  well-developed and well-nourished. No distress.  Uncomfortable appearing   HENT:  Head: Normocephalic and atraumatic.  Eyes: EOM are normal. Pupils are equal, round, and reactive to light.  Neck: Normal range of motion. Neck supple. No tracheal deviation present.  Cardiovascular: Normal rate, regular rhythm and normal heart sounds.  Exam reveals no gallop and no friction rub.   No murmur heard. Pulmonary/Chest: Effort normal and breath sounds normal. No respiratory distress. He has no wheezes. He has no rales. He exhibits no tenderness.  Abdominal: Soft. Bowel sounds are normal. He exhibits no distension and no mass. There is tenderness. There is no rebound and no guarding.  Left CVA tenderness. Mild left lower abdomen tenderness  Musculoskeletal: Normal range of motion. He exhibits no edema.  Neurological: He is alert and oriented to person, place, and time.  Skin: Skin is warm and dry. He is not diaphoretic.  Psychiatric: He has a normal mood and affect. His behavior is normal.    ED Course  Procedures (including critical care time)  DIAGNOSTIC STUDIES: Oxygen Saturation is 100% on room air, normal by my interpretation.    COORDINATION OF CARE:  9:40 PM-Discussed treatment plan which includes UA, urine microscope, BMP, CBC and pain medication with pt at bedside and pt agreed to plan.   Labs Reviewed  URINALYSIS, ROUTINE W REFLEX MICROSCOPIC - Abnormal; Notable for the following:    Glucose, UA >1000 (*)    Hgb urine dipstick TRACE (*)    Protein, ur 30 (*)    All other components within normal limits  BASIC METABOLIC PANEL - Abnormal; Notable for the following:    Glucose, Bld 219 (*)    BUN 27 (*)    Creatinine, Ser 1.60 (*)    GFR calc non Af Amer 48 (*)    GFR calc Af Amer 56 (*)    All other components within normal limits  URINE MICROSCOPIC-ADD ON  CBC   Ct Abdomen Pelvis Wo Contrast  03/17/2013   *RADIOLOGY REPORT*  Clinical Data: Left flank pain.  CT ABDOMEN AND  PELVIS WITHOUT CONTRAST  Technique:  Multidetector CT imaging of the abdomen and pelvis was performed following the standard protocol without intravenous contrast.  Comparison: 07/21/2012  Findings: Lung bases are clear.  No effusions.  Heart is normal size.  Liver, gallbladder, spleen, pancreas, adrenals and right kidney have an unremarkable unenhanced appearance.  Punctate 1-2 mm stone at the left ureteral vesicle junction with mild left hydronephrosis.  Mild perinephric stranding.  Appendix is visualized and is normal. Bowel grossly unremarkable. No free fluid, free air, or adenopathy.  No acute bony abnormality.  Postsurgical changes from posterior fusion in the lumbar spine.  IMPRESSION: Punctate left UVJ stone with mild left hydronephrosis and perinephric stranding.   Original Report Authenticated By: Charlett Nose, M.D.   1. Ureteral stone     MDM  Pt presenting with c/o left sided flank and abdominal pain.  Pt has small left UVJ stone on CT scan.  He feels improved after meds and fluids.  Mild increase in creatinine- he states this is being followed by his primary care doctor.  Discharged with strict return precautions.  Pt agreeable with plan.   I personally performed the services described in this documentation, which was scribed in my presence. The recorded information has been reviewed and is accurate.    Ethelda Chick, MD 03/18/13 [phone]

## 2013-09-25 ENCOUNTER — Encounter: Payer: Self-pay | Admitting: Neurology

## 2013-09-29 ENCOUNTER — Encounter (INDEPENDENT_AMBULATORY_CARE_PROVIDER_SITE_OTHER): Payer: Self-pay

## 2013-09-29 ENCOUNTER — Ambulatory Visit (INDEPENDENT_AMBULATORY_CARE_PROVIDER_SITE_OTHER): Payer: BC Managed Care – PPO | Admitting: Neurology

## 2013-09-29 ENCOUNTER — Other Ambulatory Visit: Payer: Self-pay | Admitting: Neurology

## 2013-09-29 ENCOUNTER — Encounter: Payer: Self-pay | Admitting: Neurology

## 2013-09-29 VITALS — BP 126/87 | HR 87 | Ht 70.5 in | Wt 283.0 lb

## 2013-09-29 DIAGNOSIS — M21372 Foot drop, left foot: Secondary | ICD-10-CM

## 2013-09-29 DIAGNOSIS — Z0289 Encounter for other administrative examinations: Secondary | ICD-10-CM

## 2013-09-29 DIAGNOSIS — M216X9 Other acquired deformities of unspecified foot: Secondary | ICD-10-CM

## 2013-09-29 DIAGNOSIS — M5416 Radiculopathy, lumbar region: Secondary | ICD-10-CM

## 2013-09-29 DIAGNOSIS — G629 Polyneuropathy, unspecified: Secondary | ICD-10-CM

## 2013-09-29 DIAGNOSIS — G609 Hereditary and idiopathic neuropathy, unspecified: Secondary | ICD-10-CM

## 2013-09-29 DIAGNOSIS — IMO0002 Reserved for concepts with insufficient information to code with codable children: Secondary | ICD-10-CM

## 2013-09-29 NOTE — Procedures (Signed)
    [facility]  NCS (NERVE CONDUCTION STUDY) WITH EMG (ELECTROMYOGRAPHY) REPORT   STUDY DATE: Jan 19th 2015 PATIENT NAME: Stephen Moyer DOB: 09/20/1960 MRN: [id number]    TECHNOLOGIST: Gearldine ShownLorraine Jones ELECTROMYOGRAPHER: Levert FeinsteinYan, Kenedie Dirocco M.D.  CLINICAL INFORMATION: 53 years old male, with past medical history of diabetes, lumbar decompression surgery 3 times, most recent one was in 2006, prior to the surgery, he complained of low back pain, radiating pain to his left lower extremity, he presenting with a year history of left dorsum foot pain, radiating towards his left leg, sometimes to his left hip, chronic midline low back pain, left foot drop  On examination: Left ankle dorsiflexion 3, eversion 4, inversion 5, left ankle plantarflexion 5. Deep tendon reflexes; bilateral patellar 1/2 trace ankle reflexes,        FINDINGS: NERVE CONDUCTION STUDY:  Bilateral peroneal sensory responses are normal . Bilateral peroneal  To EDB, and tibial motor responses were normal bilateral tibial H. reflexes were present and symmetric .    NEEDLE ELECTROMYOGRAPHY:   Selected needle examination was performed at bilateral lower extremity muscles, and bilateral lumbosacral paraspinal muscles.  Left tibialis anterior : Increased insertion activity, 2 plus spontaneous activity enlarged motor unit potential, with decreased recruitment patterns Left peroneal longus: ,  Increased insertion activity,1- 2 plus spontaneous activity enlarged motor unit potential, with decreased recruitment patterns Left tibialis posterior: Increased insertion activity, no spontaneous activity, mixture of normal and some enlarged motor unit potential, with decreased recruitment patterns Left vastus lateralis: Normally insertion activity, no spontaneous activity, mixture of normal, and some enlarged motor unit potential, with mildly decreased recruitment patterns Left gluteus medius: Normally insertion activity, no  spontaneous activity, mixture of normal, and some enlarged motor unit potential, with mildly decreased recruitment patterns Left biceps femoris short head: Normally insertion activity, no spontaneous activity, mixture of normal, and some enlarged motor unit potential, with mildly decreased recruitment patterns  Right tibialis anterior, tibialis posterior, vastus lateralis: Normally insertion activity, no spontaneous activity, mixture of normal, and some enlarged motor unit potential, with mildly decreased recruitment patterns  There was a well-healed upper lumbar spine midline scar, there was increased insertion activity, 2 plus spontaneous activity at left L4, L5, S1, there was also 2 plus spontaneous activity at right L5, S1,   IMPRESSION:   This is an abnormal study. There is active neuropathic changes involving left L4, L5 myotomes, lesser degree of chronic neuropathic changes involving bilateral S1, L5 myotomes. Above findings support a diagnosis of acute left L4, L5 lumbar radiculopathy, superimposed on bilateral chronic lumbar polyradiculopathies, he would benefit MRI of lumbar to further evaluate    INTERPRETING PHYSICIAN:   Levert FeinsteinYan, Tomas Schamp M.D. Ph.D. [facility] [address], [address] [place], [place] [address] [phone]

## 2013-09-29 NOTE — Patient Instructions (Signed)
Overall you are doing fairly well but I do want to suggest a few things today:   Remember to drink plenty of fluid, eat healthy meals and do not skip any meals. Try to eat protein with a every meal and eat a healthy snack such as fruit or nuts in between meals. Try to keep a regular sleep-wake schedule and try to exercise daily, particularly in the form of walking, 20-30 minutes a day, if you can.   As far as diagnostic testing:  1)blood work to be drawn today 2)EMG/NCS to be completed today after blood work  I would like you to follow up with physical therapy. They may be able to provide you with an ankle foot orthotic which will help treat your foot drop.  Follow up as needed. Please call us with any interim questions, concerns, problems, updates or refill requests.   My clinical assistant and will answer any of your questions and relay your messages to me and also relay most of my messages to you.   Our phone number is [phone]. We also have an after hours call service for urgent matters and there is a physician on-call for urgent questions. For any emergencies you know to call 911 or go to the nearest emergency room

## 2013-09-29 NOTE — Progress Notes (Signed)
[facility]    Provider:  Dr Hosie PoissonSumner Referring Provider: Joycelyn RuaMeyers, Stephen, MD Primary Care Physician:  Joycelyn RuaMEYERS, STEPHEN, MD   CC:  L foot pain and weakness  HPI:  Stephen Moyer is a 53 y.o. male here as a referral from Dr. Lenise ArenaMeyers for left foot pain/weakness. Patient has history of DM and C5-6 cervical fusion.   Symptoms have been ongoing for > 1year. Described as a stabbing constant pain. Can feel the pain coming up the leg. Not worse with walking. Has trouble walking up stairs with that foot. When he is walking he does notice that he catches his foot and has had some trips/falls. Has trouble raising the foot up (pointing the toes up towards the ceiling). Not worse at night with cover/sheets on it.   No history of recent trauma or injury to the leg. No recent weight gain/loss. Does not sit with his legs crossed. Does not work on his knees.   Has history of DM, HTN. No family history of neurological or nerve disorders. Has tried ibuprofen for the pain, dulls it slightly but does not relieve it.   Review of Systems: Out of a complete 14 system review, the patient complains of only the following symptoms, and all other reviewed systems are negative. Positive muscle cramps weakness  History   Social History  . Marital Status: Married    Spouse Name: Nicole CellaDorothy    Number of Children: 2  . Years of Education: 12   Occupational History  . cafeteria worker      part time    Social History Main Topics  . Smoking status: Never Smoker   . Smokeless tobacco: Never Used  . Alcohol Use: No  . Drug Use: No  . Sexual Activity: Not on file   Other Topics Concern  . Not on file   Social History Narrative   Patient walks daily.    Patient is employed as a part Archivisttime cafeteria worker.    Patient has a high school education.   Patient been married since [id number].   Patient has 2 children, 1 son 1 daughter.           No family history on file.  Past Medical History    Diagnosis Date  . Kidney stone   . Hypertension   . Diabetes mellitus without complication   . Hypercholesteremia   . Ruptured lumbar disc   . Degenerative disk disease     Past Surgical History  Procedure Laterality Date  . Back surgery    . Neck surgery      Current Outpatient Prescriptions  Medication Sig Dispense Refill  . glipiZIDE (GLUCOTROL) 10 MG tablet Take 10 mg by mouth daily.      . hydrochlorothiazide (MICROZIDE) 12.5 MG capsule Take 12.5 mg by mouth daily.      Marland Kitchen. ibuprofen (ADVIL,MOTRIN) 600 MG tablet Take 1 tablet (600 mg total) by mouth every 6 (six) hours as needed for pain.  30 tablet  0  . metFORMIN (GLUCOPHAGE) 1000 MG tablet Take 1,000 mg by mouth 2 (two) times daily with a meal.      . ONE TOUCH ULTRA TEST test strip       . pioglitazone (ACTOS) 45 MG tablet Take 45 mg by mouth daily.      . quinapril (ACCUPRIL) 40 MG tablet Take 40 mg by mouth at bedtime.      . simvastatin (ZOCOR) 80 MG tablet Take 80 mg by mouth at bedtime.  No current facility-administered medications for this visit.    Allergies as of 09/29/2013  . (No Known Allergies)    Vitals: BP 126/87  Pulse 87  Ht 5' 10.5" (1.791 m)  Wt 283 lb (128.368 kg)  BMI 40.02 kg/m2 Last Weight:  Wt Readings from Last 1 Encounters:  09/29/13 283 lb (128.368 kg)   Last Height:   Ht Readings from Last 1 Encounters:  09/29/13 5' 10.5" (1.791 m)     Physical exam: Exam: Gen: NAD, conversant Eyes: anicteric sclerae, moist conjunctivae HENT: Atraumatic, oropharynx clear Neck: Trachea midline; supple,  Lungs: CTA, no wheezing, rales, rhonic                          CV: RRR, no MRG Abdomen: Soft, non-tender;  Extremities: No peripheral edema  Skin: Normal temperature, no rash,  Psych: Appropriate affect, pleasant  Neuro: MS: AA&Ox3, appropriately interactive, normal affect   Speech: fluent w/o paraphasic error  Memory: good recent and remote recall  CN: PERRL, EOMI no  nystagmus, no ptosis, sensation intact to LT V1-V3 bilat, face symmetric, no weakness, hearing grossly intact, palate elevates symmetrically, shoulder shrug 5/5 bilat,  tongue protrudes midline, no fasiculations noted.  Motor: normal bulk and tone Strength: 5/5  In all extremities with exception of 3/5 left anterior tibialis  Coord: rapid alternating and point-to-point (FNF, HTS) movements intact.  Reflexes: symmetrical, bilat downgoing toes  Sens: decreased LT, PP in L5 distribution of left foot. Diminished vibration bilateral LE to knee level  Gait: left foot drop   Assessment:  After physical and neurologic examination, review of laboratory studies, imaging, neurophysiology testing and pre-existing records, assessment will be reviewed on the problem list.  Plan:  Treatment plan and additional workup will be reviewed under Problem List.  1)Peripheral neuropathy 2)L foot drop  53 year old gentleman presenting for initial evaluation of pain and weakness in the L5 distribution of his foot. On exam he has decreased light touch pinprick and a notable foot drop on the left. Differential would include a peripheral neuropathy ersus lumbar radiculopathy. Will check lab workup and EMG nerve conduction study. Patient referred to physical therapy for evaluation and possible ankle-foot orthotic. Can consider neurontin for symptomatic relief pending workup. Followup once workup completed.    Elspeth Cho, DO  [facility] [address] [place], [place] [address]  Phone [phone] Fax [phone]

## 2013-09-30 ENCOUNTER — Encounter: Payer: BC Managed Care – PPO | Admitting: Neurology

## 2013-10-02 LAB — PROTEIN ELECTROPHORESIS
A/G Ratio: 1.5 (ref 0.7–2.0)
Albumin ELP: 3.8 g/dL (ref 3.2–5.6)
Alpha 1: 0.2 g/dL (ref 0.1–0.4)
Alpha 2: 0.7 g/dL (ref 0.4–1.2)
Beta: 1 g/dL (ref 0.6–1.3)
Gamma Globulin: 0.6 g/dL (ref 0.5–1.6)
Globulin, Total: 2.6 g/dL (ref 2.0–4.5)
Total Protein: 6.4 g/dL (ref 6.0–8.5)

## 2013-10-02 LAB — VITAMIN B12: Vitamin B-12: 404 pg/mL (ref 211–946)

## 2013-10-02 LAB — METHYLMALONIC ACID, SERUM: Methylmalonic Acid: 236 nmol/L (ref 0–378)

## 2013-10-02 LAB — HGB A1C W/O EAG: Hgb A1c MFr Bld: 7.9 % — ABNORMAL HIGH (ref 4.8–5.6)

## 2013-10-02 LAB — TSH: TSH: 1.48 u[IU]/mL (ref 0.450–4.500)

## 2013-10-25 ENCOUNTER — Ambulatory Visit
Admission: RE | Admit: 2013-10-25 | Discharge: 2013-10-25 | Disposition: A | Discharge status: Home / Self Care | Payer: BC Managed Care – PPO | Source: Ambulatory Visit | Attending: Neurology | Admitting: Neurology

## 2013-10-25 DIAGNOSIS — M545 Low back pain, unspecified: Secondary | ICD-10-CM

## 2013-10-25 DIAGNOSIS — M5416 Radiculopathy, lumbar region: Secondary | ICD-10-CM

## 2013-10-27 ENCOUNTER — Ambulatory Visit: Payer: BC Managed Care – PPO | Attending: Neurology | Admitting: Physical Therapy

## 2013-10-27 DIAGNOSIS — G609 Hereditary and idiopathic neuropathy, unspecified: Secondary | ICD-10-CM | POA: Insufficient documentation

## 2013-10-27 DIAGNOSIS — IMO0001 Reserved for inherently not codable concepts without codable children: Secondary | ICD-10-CM | POA: Insufficient documentation

## 2013-10-27 DIAGNOSIS — R269 Unspecified abnormalities of gait and mobility: Secondary | ICD-10-CM | POA: Insufficient documentation

## 2013-10-29 ENCOUNTER — Telehealth: Payer: Self-pay | Admitting: Neurology

## 2013-10-29 NOTE — Telephone Encounter (Signed)
Called patient to discuss MRI results. Counseled him that he will likely benefit from seeing a surgeon for further evaluation. He does not require a referral and will make an appointment.

## 2015-05-04 ENCOUNTER — Emergency Department (HOSPITAL_COMMUNITY)
Admission: EM | Admit: 2015-05-04 | Discharge: 2015-05-04 | Disposition: A | Discharge status: Home / Self Care | Payer: BLUE CROSS/BLUE SHIELD | Attending: Emergency Medicine | Admitting: Emergency Medicine

## 2015-05-04 ENCOUNTER — Encounter (HOSPITAL_COMMUNITY): Payer: Self-pay | Admitting: Emergency Medicine

## 2015-05-04 DIAGNOSIS — Z79899 Other long term (current) drug therapy: Secondary | ICD-10-CM | POA: Diagnosis not present

## 2015-05-04 DIAGNOSIS — E780 Pure hypercholesterolemia: Secondary | ICD-10-CM | POA: Insufficient documentation

## 2015-05-04 DIAGNOSIS — E119 Type 2 diabetes mellitus without complications: Secondary | ICD-10-CM | POA: Insufficient documentation

## 2015-05-04 DIAGNOSIS — Z8739 Personal history of other diseases of the musculoskeletal system and connective tissue: Secondary | ICD-10-CM | POA: Insufficient documentation

## 2015-05-04 DIAGNOSIS — M5441 Lumbago with sciatica, right side: Secondary | ICD-10-CM

## 2015-05-04 DIAGNOSIS — Z87442 Personal history of urinary calculi: Secondary | ICD-10-CM | POA: Insufficient documentation

## 2015-05-04 DIAGNOSIS — I10 Essential (primary) hypertension: Secondary | ICD-10-CM | POA: Insufficient documentation

## 2015-05-04 DIAGNOSIS — M25551 Pain in right hip: Secondary | ICD-10-CM | POA: Diagnosis present

## 2015-05-04 MED ORDER — METHOCARBAMOL 500 MG PO TABS
500.0000 mg | ORAL_TABLET | Freq: Once | ORAL | Status: AC
  Administered 2015-05-04: 500 mg via ORAL
  Filled 2015-05-04: qty 1

## 2015-05-04 MED ORDER — OXYCODONE-ACETAMINOPHEN 5-325 MG PO TABS: 1.0000 | ORAL_TABLET | ORAL | Status: DC | PRN

## 2015-05-04 MED ORDER — MORPHINE SULFATE (PF) 4 MG/ML IV SOLN
4.0000 mg | Freq: Once | INTRAVENOUS | Status: AC
  Administered 2015-05-04: 4 mg via INTRAMUSCULAR
  Filled 2015-05-04: qty 1

## 2015-05-04 MED ORDER — METHOCARBAMOL 500 MG PO TABS: 500.0000 mg | ORAL_TABLET | Freq: Two times a day (BID) | ORAL | Status: DC | PRN

## 2015-05-04 NOTE — ED Provider Notes (Signed)
CSN: [id number]     Arrival date & time 05/04/15  2035 History  This chart was scribed for non-physician practitioner, Everlene Farrier, PA-C working with Samuel Jester, DO by Gwenyth Ober, ED scribe. This patient was seen in room TR11C/TR11C and the patient's care was started at 9:29 PM   Chief Complaint  Patient presents with  . Hip Pain   The history is provided by the patient. No language interpreter was used.   HPI Comments: Stephen Moyer is a 54 y.o. male with a history of HTN, DM and DDD who presents to the Emergency Department complaining of constant, moderate, shooting right hip pain that radiates to his right leg and started 2 days ago. His pain becomes worse with walking and movement. Pt was seen at [facility] last night for the same and was given IM Toradol which provided some relief. He was also prescribed methocarbomol, prednisone and hydrocodone. Pt denies recent falls or injuries. He has a history of back surgeries done by Dr. Mikal Plane and Dr. Jordan Likes. He has a follow-up with Dr. Jordan Likes next week. Pt has a history of sciatica and states current symptoms are similar. Pt regularly checks his blood sugar, which he treats with glipizide and Metformin. He denies bladder or bowel incontinence, numbness, tingling, CP, SOB, difficulty urinating, nausea, vomiting and abdominal pain as associated symptoms.  Back surgeries: Dr. Mikal Plane (2004), Dr. Jordan Likes (2008)  Past Medical History  Diagnosis Date  . Kidney stone   . Hypertension   . Diabetes mellitus without complication   . Hypercholesteremia   . Ruptured lumbar disc   . Degenerative disk disease    Past Surgical History  Procedure Laterality Date  . Back surgery    . Neck surgery     No family history on file. Social History  Substance Use Topics  . Smoking status: Never Smoker   . Smokeless tobacco: Never Used  . Alcohol Use: No    Review of Systems  Constitutional: Negative for fever and chills.  Respiratory: Negative for shortness  of breath.   Cardiovascular: Negative for chest pain.  Gastrointestinal: Negative for nausea, vomiting and abdominal pain.  Genitourinary: Negative for dysuria, frequency, hematuria and difficulty urinating.  Musculoskeletal: Positive for arthralgias and gait problem. Negative for neck pain and neck stiffness.  Neurological: Negative for weakness, numbness and headaches.      Allergies  Review of patient's allergies indicates no known allergies.  Home Medications   Prior to Admission medications   Medication Sig Start Date End Date Taking? Authorizing Provider  glipiZIDE (GLUCOTROL) 10 MG tablet Take 10 mg by mouth daily.    Historical Provider, MD  hydrochlorothiazide (MICROZIDE) 12.5 MG capsule Take 12.5 mg by mouth daily.    Historical Provider, MD  metFORMIN (GLUCOPHAGE) 1000 MG tablet Take 1,000 mg by mouth 2 (two) times daily with a meal.    Historical Provider, MD  methocarbamol (ROBAXIN) 500 MG tablet Take 1 tablet (500 mg total) by mouth 2 (two) times daily as needed for muscle spasms. 05/04/15   Everlene Farrier, PA-C  ONE TOUCH ULTRA TEST test strip  09/05/13   Historical Provider, MD  oxyCODONE-acetaminophen (PERCOCET/ROXICET) 5-325 MG per tablet Take 1 tablet by mouth every 4 (four) hours as needed for severe pain. May take 2 tablets PO q 6 hours for severe pain - Do not take with Tylenol as this tablet already contains tylenol 05/04/15   Everlene Farrier, PA-C  pioglitazone (ACTOS) 45 MG tablet Take 45 mg by mouth  daily.    Historical Provider, MD  quinapril (ACCUPRIL) 40 MG tablet Take 40 mg by mouth at bedtime.    Historical Provider, MD  simvastatin (ZOCOR) 80 MG tablet Take 80 mg by mouth at bedtime.    Historical Provider, MD   BP 108/54 mmHg  Pulse 68  Temp(Src) 97.8 F (36.6 C) (Oral)  Resp 20  Ht 5\' 6"  (1.676 m)  Wt 275 lb (124.739 kg)  BMI 44.41 kg/m2  SpO2 98% Physical Exam  Constitutional: He is oriented to person, place, and time. He appears well-developed and  well-nourished. No distress.  HENT:  Head: Normocephalic and atraumatic.  Eyes: Conjunctivae are normal. Pupils are equal, round, and reactive to light. Right eye exhibits no discharge. Left eye exhibits no discharge.  Neck: Neck supple.  Cardiovascular: Normal rate, regular rhythm, normal heart sounds and intact distal pulses.   Pulses:      Dorsalis pedis pulses are 2+ on the right side, and 2+ on the left side.       Posterior tibial pulses are 2+ on the right side, and 2+ on the left side.  Pulmonary/Chest: Effort normal and breath sounds normal. No respiratory distress. He has no wheezes. He has no rales.  Abdominal: Soft. He exhibits no distension. There is no tenderness.  Musculoskeletal: Normal range of motion. He exhibits no edema or tenderness.  No calf tenderness or edema. No midline back tenderness. Right low back tenderness to palpation. No back edema, deformity, ecchymosis or crepitus noted. No pelvic instability noted. No pain with manipulation of his right leg at his hip. Patient has good strength in his bilateral upper and lower extremities.  Lymphadenopathy:    He has no cervical adenopathy.  Neurological: He is alert and oriented to person, place, and time. Coordination normal.  Sensation intact to bilateral lower extremities.   Skin: Skin is warm and dry. No rash noted. He is not diaphoretic. No erythema. No pallor.  Psychiatric: He has a normal mood and affect. His behavior is normal.  Nursing note and vitals reviewed.   ED Course  Procedures  DIAGNOSTIC STUDIES: Oxygen Saturation is 98% on RA, normal by my interpretation.    COORDINATION OF CARE: 9:39 PM Discussed treatment plan with pt which includes morphine, a muscle relaxant and a prescription for Percocet. He agreed to plan.    Labs Review Labs Reviewed - No data to display  Imaging Review No results found.   EKG Interpretation None      Filed Vitals:   05/04/15 2046 05/04/15 2158  BP: 104/74  108/54  Pulse: 96 68  Temp: 97.3 F (36.3 C) 97.8 F (36.6 C)  TempSrc: Oral Oral  Resp: 22 20  Height: 5\' 6"  (1.676 m)   Weight: 275 lb (124.739 kg)   SpO2: 98% 98%     MDM   Meds given in ED:  Medications  morphine 4 MG/ML injection 4 mg (4 mg Intramuscular Given 05/04/15 2223)  methocarbamol (ROBAXIN) tablet 500 mg (500 mg Oral Given 05/04/15 2224)    New Prescriptions   METHOCARBAMOL (ROBAXIN) 500 MG TABLET    Take 1 tablet (500 mg total) by mouth 2 (two) times daily as needed for muscle spasms.   OXYCODONE-ACETAMINOPHEN (PERCOCET/ROXICET) 5-325 MG PER TABLET    Take 1 tablet by mouth every 4 (four) hours as needed for severe pain. May take 2 tablets PO q 6 hours for severe pain - Do not take with Tylenol as this tablet already contains tylenol  Final diagnoses:  Right-sided low back pain with right-sided sciatica   Patient with right low back pain that radiates to his right posterior buttocks and leg. He reports this feels like his previous sciatica.  No neurological deficits and normal neuro exam.  Patient can walk but states is painful.  No loss of bowel or bladder control.  No concern for cauda equina.  No fever, night sweats, weight loss, h/o cancer, IVDU.  RICE protocol and pain medicine indicated and discussed with patient. Due to blood work previously indicating a creatinine of 1.60 and do not feel the patient is also to receive Toradol or NSAIDs. I also advised patient to stop taking prednisone as he is a diabetic and manages his blood sugars only with oral medication. Will place the patient on a short course of Percocet and Robaxin and Naprosyn follow-up with his primary care provider and orthopedic surgeon closely. I advised the patient to follow-up with their primary care provider this week. I advised the patient to return to the emergency department with new or worsening symptoms or new concerns. The patient verbalized understanding and agreement with plan.    I  personally performed the services described in this documentation, which was scribed in my presence. The recorded information has been reviewed and is accurate.       Everlene Farrier, PA-C 05/04/15 2317  Samuel Jester, DO 05/07/15 [phone]

## 2015-05-04 NOTE — Discharge Instructions (Signed)
Back Exercises Back exercises help treat and prevent back injuries. The goal of back exercises is to increase the strength of your abdominal and back muscles and the flexibility of your back. These exercises should be started when you no longer have back pain. Back exercises include:  Pelvic Tilt. Lie on your back with your knees bent. Tilt your pelvis until the lower part of your back is against the floor. Hold this position 5 to 10 sec and repeat 5 to 10 times.  Knee to Chest. Pull first 1 knee up against your chest and hold for 20 to 30 seconds, repeat this with the other knee, and then both knees. This may be done with the other leg straight or bent, whichever feels better.  Sit-Ups or Curl-Ups. Bend your knees 90 degrees. Start with tilting your pelvis, and do a partial, slow sit-up, lifting your trunk only 30 to 45 degrees off the floor. Take at least 2 to 3 seconds for each sit-up. Do not do sit-ups with your knees out straight. If partial sit-ups are difficult, simply do the above but with only tightening your abdominal muscles and holding it as directed.  Hip-Lift. Lie on your back with your knees flexed 90 degrees. Push down with your feet and shoulders as you raise your hips a couple inches off the floor; hold for 10 seconds, repeat 5 to 10 times.  Back arches. Lie on your stomach, propping yourself up on bent elbows. Slowly press on your hands, causing an arch in your low back. Repeat 3 to 5 times. Any initial stiffness and discomfort should lessen with repetition over time.  Shoulder-Lifts. Lie face down with arms beside your body. Keep hips and torso pressed to floor as you slowly lift your head and shoulders off the floor. Do not overdo your exercises, especially in the beginning. Exercises may cause you some mild back discomfort which lasts for a few minutes; however, if the pain is more severe, or lasts for more than 15 minutes, do not continue exercises until you see your caregiver.  Improvement with exercise therapy for back problems is slow.  See your caregivers for assistance with developing a proper back exercise program. Document Released: 10/05/2004 Document Revised: 11/20/2011 Document Reviewed: 06/29/2011 [facility] Patient Information 2015 [place], [place]. This information is not intended to replace advice given to you by your health care provider. Make sure you discuss any questions you have with your health care provider.  Back Pain, Adult Back pain is very common. The pain often gets better over time. The cause of back pain is usually not dangerous. Most people can learn to manage their back pain on their own.  HOME CARE   Stay active. Start with short walks on flat ground if you can. Try to walk farther each day.  Do not sit, drive, or stand in one place for more than 30 minutes. Do not stay in bed.  Do not avoid exercise or work. Activity can help your back heal faster.  Be careful when you bend or lift an object. Bend at your knees, keep the object close to you, and do not twist.  Sleep on a firm mattress. Lie on your side, and bend your knees. If you lie on your back, put a pillow under your knees.  Only take medicines as told by your doctor.  Put ice on the injured area.  Put ice in a plastic bag.  Place a towel between your skin and the bag.  Leave the  ice on for 15-20 minutes, 03-04 times a day for the first 2 to 3 days. After that, you can switch between ice and heat packs.  Ask your doctor about back exercises or massage.  Avoid feeling anxious or stressed. Find good ways to deal with stress, such as exercise. GET HELP RIGHT AWAY IF:   Your pain does not go away with rest or medicine.  Your pain does not go away in 1 week.  You have new problems.  You do not feel well.  The pain spreads into your legs.  You cannot control when you poop (bowel movement) or pee (urinate).  Your arms or legs feel weak or lose feeling  (numbness).  You feel sick to your stomach (nauseous) or throw up (vomit).  You have belly (abdominal) pain.  You feel like you may pass out (faint). MAKE SURE YOU:   Understand these instructions.  Will watch your condition.  Will get help right away if you are not doing well or get worse. Document Released: 02/14/2008 Document Revised: 11/20/2011 Document Reviewed: 12/30/2013 [facility] Patient Information 2015 [place], [place]. This information is not intended to replace advice given to you by your health care provider. Make sure you discuss any questions you have with your health care provider. Radicular Pain Radicular pain in either the arm or leg is usually from a bulging or herniated disk in the spine. A piece of the herniated disk may press against the nerves as the nerves exit the spine. This causes pain which is felt at the tips of the nerves down the arm or leg. Other causes of radicular pain may include:  Fractures.  Heart disease.  Cancer.  An abnormal and usually degenerative state of the nervous system or nerves (neuropathy). Diagnosis may require CT or MRI scanning to determine the primary cause.  Nerves that start at the neck (nerve roots) may cause radicular pain in the outer shoulder and arm. It can spread down to the thumb and fingers. The symptoms vary depending on which nerve root has been affected. In most cases radicular pain improves with conservative treatment. Neck problems may require physical therapy, a neck collar, or cervical traction. Treatment may take many weeks, and surgery may be considered if the symptoms do not improve.  Conservative treatment is also recommended for sciatica. Sciatica causes pain to radiate from the lower back or buttock area down the leg into the foot. Often there is a history of back problems. Most patients with sciatica are better after 2 to 4 weeks of rest and other supportive care. Short term bed rest can reduce the disk pressure  considerably. Sitting, however, is not a good position since this increases the pressure on the disk. You should avoid bending, lifting, and all other activities which make the problem worse. Traction can be used in severe cases. Surgery is usually reserved for patients who do not improve within the first months of treatment. Only take over-the-counter or prescription medicines for pain, discomfort, or fever as directed by your caregiver. Narcotics and muscle relaxants may help by relieving more severe pain and spasm and by providing mild sedation. Cold or massage can give significant relief. Spinal manipulation is not recommended. It can increase the degree of disc protrusion. Epidural steroid injections are often effective treatment for radicular pain. These injections deliver medicine to the spinal nerve in the space between the protective covering of the spinal cord and back bones (vertebrae). Your caregiver can give you more information about steroid  injections. These injections are most effective when given within two weeks of the onset of pain.  You should see your caregiver for follow up care as recommended. A program for neck and back injury rehabilitation with stretching and strengthening exercises is an important part of management.  SEEK IMMEDIATE MEDICAL CARE IF:  You develop increased pain, weakness, or numbness in your arm or leg.  You develop difficulty with bladder or bowel control.  You develop abdominal pain. Document Released: 10/05/2004 Document Revised: 11/20/2011 Document Reviewed: 12/21/2008 [facility] Patient Information 2015 [place], [place]. This information is not intended to replace advice given to you by your health care provider. Make sure you discuss any questions you have with your health care provider.

## 2015-05-04 NOTE — ED Notes (Addendum)
Pt reports that since this past Monday he has R hip pain radiating down leg. Pt reports increased difficulty walking today. Seen yesterday and prescribed meds but they are not helping. Denies injury. Hx of sciatica years ago. Denies loss of bowel or urine.

## 2015-05-05 ENCOUNTER — Encounter (HOSPITAL_BASED_OUTPATIENT_CLINIC_OR_DEPARTMENT_OTHER): Payer: Self-pay

## 2015-05-05 ENCOUNTER — Emergency Department (HOSPITAL_BASED_OUTPATIENT_CLINIC_OR_DEPARTMENT_OTHER): Payer: BLUE CROSS/BLUE SHIELD

## 2015-05-05 ENCOUNTER — Emergency Department (HOSPITAL_BASED_OUTPATIENT_CLINIC_OR_DEPARTMENT_OTHER)
Admission: EM | Admit: 2015-05-05 | Discharge: 2015-05-05 | Disposition: A | Discharge status: Home / Self Care | Payer: BLUE CROSS/BLUE SHIELD | Attending: Emergency Medicine | Admitting: Emergency Medicine

## 2015-05-05 DIAGNOSIS — Z87442 Personal history of urinary calculi: Secondary | ICD-10-CM | POA: Insufficient documentation

## 2015-05-05 DIAGNOSIS — E119 Type 2 diabetes mellitus without complications: Secondary | ICD-10-CM | POA: Insufficient documentation

## 2015-05-05 DIAGNOSIS — Z79899 Other long term (current) drug therapy: Secondary | ICD-10-CM | POA: Insufficient documentation

## 2015-05-05 DIAGNOSIS — M5442 Lumbago with sciatica, left side: Secondary | ICD-10-CM | POA: Insufficient documentation

## 2015-05-05 DIAGNOSIS — I10 Essential (primary) hypertension: Secondary | ICD-10-CM | POA: Diagnosis not present

## 2015-05-05 DIAGNOSIS — M545 Low back pain: Secondary | ICD-10-CM | POA: Diagnosis present

## 2015-05-05 DIAGNOSIS — E780 Pure hypercholesterolemia: Secondary | ICD-10-CM | POA: Insufficient documentation

## 2015-05-05 LAB — CBC WITH DIFFERENTIAL/PLATELET
Basophils Absolute: 0 K/uL (ref 0.0–0.1)
Basophils Relative: 0 % (ref 0–1)
Eosinophils Absolute: 0 K/uL (ref 0.0–0.7)
Eosinophils Relative: 0 % (ref 0–5)
HCT: 42 % (ref 39.0–52.0)
Hemoglobin: 14.3 g/dL (ref 13.0–17.0)
Lymphocytes Relative: 23 % (ref 12–46)
Lymphs Abs: 1.6 K/uL (ref 0.7–4.0)
MCH: 29.2 pg (ref 26.0–34.0)
MCHC: 34 g/dL (ref 30.0–36.0)
MCV: 85.9 fL (ref 78.0–100.0)
Monocytes Absolute: 0.5 K/uL (ref 0.1–1.0)
Monocytes Relative: 8 % (ref 3–12)
Neutro Abs: 4.8 K/uL (ref 1.7–7.7)
Neutrophils Relative %: 69 % (ref 43–77)
Platelets: 173 K/uL (ref 150–400)
RBC: 4.89 MIL/uL (ref 4.22–5.81)
RDW: 13.5 % (ref 11.5–15.5)
WBC: 7 K/uL (ref 4.0–10.5)

## 2015-05-05 LAB — BASIC METABOLIC PANEL WITH GFR
Anion gap: 15 (ref 5–15)
BUN: 23 mg/dL — ABNORMAL HIGH (ref 6–20)
CO2: 21 mmol/L — ABNORMAL LOW (ref 22–32)
Calcium: 9.5 mg/dL (ref 8.9–10.3)
Chloride: 103 mmol/L (ref 101–111)
Creatinine, Ser: 1.31 mg/dL — ABNORMAL HIGH (ref 0.61–1.24)
GFR calc Af Amer: >60 mL/min (ref >60)
GFR calc non Af Amer: >60 mL/min (ref >60)
Glucose, Bld: 165 mg/dL — ABNORMAL HIGH (ref 65–99)
Potassium: 3.6 mmol/L (ref 3.5–5.1)
Sodium: 139 mmol/L (ref 135–145)

## 2015-05-05 MED ORDER — HYDROMORPHONE HCL 1 MG/ML IJ SOLN
1.0000 mg | Freq: Once | INTRAMUSCULAR | Status: AC
  Administered 2015-05-05: 1 mg via INTRAVENOUS
  Filled 2015-05-05: qty 1

## 2015-05-05 NOTE — ED Provider Notes (Addendum)
CSN: [id number]     Arrival date & time 05/05/15  1330 History   First MD Initiated Contact with Patient 05/05/15 1331     Chief Complaint  Patient presents with  . Back Pain     (Consider location/radiation/quality/duration/timing/severity/associated sxs/prior Treatment) Patient is a 54 y.o. male presenting with back pain.  Back Pain Location:  Lumbar spine Quality:  Stabbing and shooting Radiates to:  Does not radiate Pain severity:  Severe Pain is:  Same all the time Onset quality:  Gradual Duration:  3 days Timing:  Constant Progression:  Unchanged Chronicity:  Chronic Context: not falling, not recent illness and not recent injury   Relieved by:  Nothing Worsened by:  Movement, standing, touching and twisting Associated symptoms: no abdominal pain, no bladder incontinence, no bowel incontinence, no fever, no numbness and no perianal numbness     Past Medical History  Diagnosis Date  . Kidney stone   . Hypertension   . Diabetes mellitus without complication   . Hypercholesteremia   . Ruptured lumbar disc   . Degenerative disk disease    Past Surgical History  Procedure Laterality Date  . Back surgery    . Neck surgery     No family history on file. Social History  Substance Use Topics  . Smoking status: Never Smoker   . Smokeless tobacco: Never Used  . Alcohol Use: No    Review of Systems  Constitutional: Negative for fever.  Gastrointestinal: Negative for abdominal pain and bowel incontinence.  Genitourinary: Negative for bladder incontinence.  Musculoskeletal: Positive for back pain.  Neurological: Negative for numbness.  All other systems reviewed and are negative.     Allergies  Review of patient's allergies indicates no known allergies.  Home Medications   Prior to Admission medications   Medication Sig Start Date End Date Taking? Authorizing Provider  glipiZIDE (GLUCOTROL) 10 MG tablet Take 10 mg by mouth daily.    Historical Provider, MD   hydrochlorothiazide (MICROZIDE) 12.5 MG capsule Take 12.5 mg by mouth daily.    Historical Provider, MD  metFORMIN (GLUCOPHAGE) 1000 MG tablet Take 1,000 mg by mouth 2 (two) times daily with a meal.    Historical Provider, MD  methocarbamol (ROBAXIN) 500 MG tablet Take 1 tablet (500 mg total) by mouth 2 (two) times daily as needed for muscle spasms. 05/04/15   Everlene Farrier, PA-C  ONE TOUCH ULTRA TEST test strip  09/05/13   Historical Provider, MD  oxyCODONE-acetaminophen (PERCOCET/ROXICET) 5-325 MG per tablet Take 1 tablet by mouth every 4 (four) hours as needed for severe pain. May take 2 tablets PO q 6 hours for severe pain - Do not take with Tylenol as this tablet already contains tylenol 05/04/15   Everlene Farrier, PA-C  pioglitazone (ACTOS) 45 MG tablet Take 45 mg by mouth daily.    Historical Provider, MD  quinapril (ACCUPRIL) 40 MG tablet Take 40 mg by mouth at bedtime.    Historical Provider, MD  simvastatin (ZOCOR) 80 MG tablet Take 80 mg by mouth at bedtime.    Historical Provider, MD   BP 111/57 mmHg  Pulse 76  Temp(Src) 98 F (36.7 C) (Oral)  Resp 20  Ht  (1.676 m)  Wt 275 lb (124.739 kg)  BMI 44.41 kg/m2  SpO2 100% Physical Exam  Constitutional: He is oriented to person, place, and time. He appears well-developed and well-nourished.  HENT:  Head: Normocephalic and atraumatic.  Eyes: Conjunctivae and EOM are normal.  Neck: Normal range  of motion. Neck supple.  Cardiovascular: Normal rate, regular rhythm and normal heart sounds.   Pulmonary/Chest: Effort normal and breath sounds normal. No respiratory distress.  Abdominal: He exhibits no distension. There is no tenderness. There is no rebound and no guarding.  Musculoskeletal: Normal range of motion.       Cervical back: Normal.       Thoracic back: Normal.       Lumbar back: He exhibits tenderness and bony tenderness.  Straight leg positive on R  Neurological: He is alert and oriented to person, place, and time.  No  focal neuro deficits of legs  Skin: Skin is warm and dry.  Vitals reviewed.   ED Course  Procedures (including critical care time) Labs Review Labs Reviewed  BASIC METABOLIC PANEL - Abnormal; Notable for the following:    CO2 21 (*)    Glucose, Bld 165 (*)    BUN 23 (*)    Creatinine, Ser 1.31 (*)    All other components within normal limits  CBC WITH DIFFERENTIAL/PLATELET    Imaging Review No results found. I have personally reviewed and evaluated these images and lab results as part of my medical decision-making.   EKG Interpretation None      MDM   Final diagnoses:  Bilateral low back pain with left-sided sciatica    54 y.o. male with pertinent PMH of chronic back pain, prior lumbar discectomy, prior surgery Dr. Mikal Plane (2004), Dr. Jordan Likes (2008) presents with continued back pain, states he is unable to walk.  On arrival vitals and physical exam as above.  Exam consistent with sciatica.  Wu as above, unremarkable.  Pt given dilaudid 1mg  x 2 and was able to ambulate.  No neuro deficits of legs.  Discussed strict return precautions at length, pt voiced understanding and will potentially fu this week with [facility] if there is a cancellation.  I have reviewed all laboratory and imaging studies if ordered as above  Bil back pain with sciatica    Mirian Mo, MD 05/06/15 1610  Mirian Mo, MD 06/17/15 1540

## 2015-05-05 NOTE — ED Notes (Signed)
MD at bedside. 

## 2015-05-05 NOTE — ED Notes (Signed)
Pt reports he did take oxycodone and robaxin this morning at 1100

## 2015-05-05 NOTE — ED Notes (Signed)
Pt reports that MDs manipulation of right leg caused the pain to go back up to a 10/10.

## 2015-05-05 NOTE — ED Notes (Signed)
Pt brought in by [facility] for back pain. Hx of back surgeries. Pt coming from home. Went to get up, knees buckled, unable to stand due to pain. Pain 9/10 initially, 7/10 after fentanyl in route. 18 G LAC. 120 palp, 88, 16. Pt was seen at [facility] yesterday, UC prior to that. Unable to get in with surgeon until next week. Pt had L3-5 fusion in 2006. Pt c/o right lower back pain radiating into right hip.

## 2015-05-19 ENCOUNTER — Other Ambulatory Visit: Payer: Self-pay | Admitting: Neurosurgery

## 2015-05-19 DIAGNOSIS — M48061 Spinal stenosis, lumbar region without neurogenic claudication: Secondary | ICD-10-CM

## 2015-05-30 ENCOUNTER — Ambulatory Visit
Admission: RE | Admit: 2015-05-30 | Discharge: 2015-05-30 | Disposition: A | Discharge status: Home / Self Care | Payer: BLUE CROSS/BLUE SHIELD | Source: Ambulatory Visit | Attending: Neurosurgery | Admitting: Neurosurgery

## 2015-05-30 DIAGNOSIS — M48061 Spinal stenosis, lumbar region without neurogenic claudication: Secondary | ICD-10-CM

## 2015-05-30 MED ORDER — GADOBENATE DIMEGLUMINE 529 MG/ML IV SOLN
20.0000 mL | Freq: Once | INTRAVENOUS | Status: AC | PRN
  Administered 2015-05-30: 20 mL via INTRAVENOUS

## 2017-09-06 DIAGNOSIS — Z23 Encounter for immunization: Secondary | ICD-10-CM | POA: Diagnosis not present

## 2017-09-06 DIAGNOSIS — I10 Essential (primary) hypertension: Secondary | ICD-10-CM | POA: Diagnosis not present

## 2017-09-06 DIAGNOSIS — E785 Hyperlipidemia, unspecified: Secondary | ICD-10-CM | POA: Diagnosis not present

## 2017-09-06 DIAGNOSIS — E1121 Type 2 diabetes mellitus with diabetic nephropathy: Secondary | ICD-10-CM | POA: Diagnosis not present

## 2017-10-26 DIAGNOSIS — E119 Type 2 diabetes mellitus without complications: Secondary | ICD-10-CM | POA: Diagnosis not present

## 2018-03-07 DIAGNOSIS — I10 Essential (primary) hypertension: Secondary | ICD-10-CM | POA: Diagnosis not present

## 2018-03-07 DIAGNOSIS — E1121 Type 2 diabetes mellitus with diabetic nephropathy: Secondary | ICD-10-CM | POA: Diagnosis not present

## 2018-09-09 DIAGNOSIS — E785 Hyperlipidemia, unspecified: Secondary | ICD-10-CM | POA: Diagnosis not present

## 2018-09-09 DIAGNOSIS — Z125 Encounter for screening for malignant neoplasm of prostate: Secondary | ICD-10-CM | POA: Diagnosis not present

## 2018-09-09 DIAGNOSIS — E1121 Type 2 diabetes mellitus with diabetic nephropathy: Secondary | ICD-10-CM | POA: Diagnosis not present

## 2018-09-09 DIAGNOSIS — I10 Essential (primary) hypertension: Secondary | ICD-10-CM | POA: Diagnosis not present

## 2018-10-08 ENCOUNTER — Other Ambulatory Visit: Payer: Self-pay

## 2018-10-08 ENCOUNTER — Encounter (HOSPITAL_BASED_OUTPATIENT_CLINIC_OR_DEPARTMENT_OTHER): Payer: Self-pay | Admitting: *Deleted

## 2018-10-08 ENCOUNTER — Emergency Department (HOSPITAL_BASED_OUTPATIENT_CLINIC_OR_DEPARTMENT_OTHER): Payer: 59

## 2018-10-08 ENCOUNTER — Emergency Department (HOSPITAL_BASED_OUTPATIENT_CLINIC_OR_DEPARTMENT_OTHER)
Admission: EM | Admit: 2018-10-08 | Discharge: 2018-10-08 | Disposition: A | Discharge status: Home / Self Care | Payer: 59 | Attending: Emergency Medicine | Admitting: Emergency Medicine

## 2018-10-08 DIAGNOSIS — E119 Type 2 diabetes mellitus without complications: Secondary | ICD-10-CM | POA: Diagnosis not present

## 2018-10-08 DIAGNOSIS — Z79899 Other long term (current) drug therapy: Secondary | ICD-10-CM | POA: Insufficient documentation

## 2018-10-08 DIAGNOSIS — Z7984 Long term (current) use of oral hypoglycemic drugs: Secondary | ICD-10-CM | POA: Insufficient documentation

## 2018-10-08 DIAGNOSIS — J111 Influenza due to unidentified influenza virus with other respiratory manifestations: Secondary | ICD-10-CM | POA: Diagnosis not present

## 2018-10-08 DIAGNOSIS — R05 Cough: Secondary | ICD-10-CM | POA: Diagnosis not present

## 2018-10-08 DIAGNOSIS — I10 Essential (primary) hypertension: Secondary | ICD-10-CM | POA: Insufficient documentation

## 2018-10-08 DIAGNOSIS — Z20828 Contact with and (suspected) exposure to other viral communicable diseases: Secondary | ICD-10-CM | POA: Diagnosis not present

## 2018-10-08 MED ORDER — ALBUTEROL SULFATE HFA 108 (90 BASE) MCG/ACT IN AERS
2.0000 | INHALATION_SPRAY | Freq: Once | RESPIRATORY_TRACT | Status: AC
  Administered 2018-10-08: 2 via RESPIRATORY_TRACT
  Filled 2018-10-08: qty 6.7

## 2018-10-08 MED ORDER — OSELTAMIVIR PHOSPHATE 75 MG PO CAPS: 75.0000 mg | ORAL_CAPSULE | Freq: Two times a day (BID) | ORAL | 0 refills | Status: DC

## 2018-10-08 NOTE — ED Provider Notes (Signed)
[facility] EMERGENCY DEPARTMENT Provider Note   CSN: [id number] Arrival date & time: 10/08/18  1944     History   Chief Complaint Chief Complaint  Patient presents with  . Influenza  . Cough    HPI Stephen Moyer is a 58 y.o. male with history of hypertension, diabetes who presents with a 2-day history of cough, runny nose.  Patient has not had much of fever.  [facility] and was sent here to rule out pneumonia as his oxygen levels went down to 90%.  Patient's blood pressure is low today, however he states it has been low for the past couple years.  He continues to take 2 blood pressure medications and he is to talk to his doctor about stopping 1, however they have not.  He denies any dizziness or lightheadedness.  He denies any chest pain, shortness of breath.  He reports some abdominal soreness from coughing, but denies any abdominal pain, nausea, vomiting.  He has not taken any medications at home for symptoms.  HPI  Past Medical History:  Diagnosis Date  . Degenerative disk disease   . Diabetes mellitus without complication (HCC)   . Hypercholesteremia   . Hypertension   . Kidney stone   . Ruptured lumbar disc     Patient Active Problem List   Diagnosis Date Noted  . Peripheral neuropathy 09/29/2013  . Left foot drop 09/29/2013  . Lumbar radiculopathy 09/29/2013    Past Surgical History:  Procedure Laterality Date  . BACK SURGERY    . NECK SURGERY          Home Medications    Prior to Admission medications   Medication Sig Start Date End Date Taking? Authorizing Provider  glipiZIDE (GLUCOTROL) 10 MG tablet Take 10 mg by mouth daily.    [provider]  hydrochlorothiazide (MICROZIDE) 12.5 MG capsule Take 12.5 mg by mouth daily.    [provider]  metFORMIN (GLUCOPHAGE) 1000 MG tablet Take 1,000 mg by mouth 2 (two) times daily with a meal.    [provider]  methocarbamol (ROBAXIN)  500 MG tablet Take 1 tablet (500 mg total) by mouth 2 (two) times daily as needed for muscle spasms. 05/04/15   Everlene Farrieransie, William, PA-C  ONE TOUCH ULTRA TEST test strip  09/05/13   [provider]  oseltamivir (TAMIFLU) 75 MG capsule Take 1 capsule (75 mg total) by mouth every 12 (twelve) hours. 10/08/18   Emi HolesLaw, Terique Kawabata M, PA-C  oxyCODONE-acetaminophen (PERCOCET/ROXICET) 5-325 MG per tablet Take 1 tablet by mouth every 4 (four) hours as needed for severe pain. May take 2 tablets PO q 6 hours for severe pain - Do not take with Tylenol as this tablet already contains tylenol 05/04/15   Everlene Farrieransie, William, PA-C  pioglitazone (ACTOS) 45 MG tablet Take 45 mg by mouth daily.    [provider]  quinapril (ACCUPRIL) 40 MG tablet Take 40 mg by mouth at bedtime.    [provider]  simvastatin (ZOCOR) 80 MG tablet Take 80 mg by mouth at bedtime.    [provider]    Family History No family history on file.  Social History Social History   Tobacco Use  . Smoking status: Never Smoker  . Smokeless tobacco: Never Used  Substance Use Topics  . Alcohol use: No  . Drug use: No     Allergies   Patient has no known allergies.   Review of  Systems Review of Systems  Constitutional: Negative for chills and fever.  HENT: Positive for rhinorrhea. Negative for facial swelling and sore throat.   Respiratory: Positive for cough. Negative for shortness of breath.   Cardiovascular: Negative for chest pain.  Gastrointestinal: Negative for abdominal pain, nausea and vomiting.  Genitourinary: Negative for dysuria.  Musculoskeletal: Negative for back pain.  Skin: Negative for rash and wound.  Neurological: Negative for headaches.  Psychiatric/Behavioral: The patient is not nervous/anxious.      Physical Exam Updated Vital Signs BP (!) 87/61   Pulse 96   Temp 98.4 F (36.9 C) (Axillary) Comment: chewing ice cubes  Resp 18   Ht 5\' 6"  (1.676 m)   Wt 128.4 kg   SpO2 94%    BMI 45.68 kg/m   Physical Exam Vitals signs and nursing note reviewed.  Constitutional:      General: He is not in acute distress.    Appearance: He is well-developed. He is not diaphoretic.  HENT:     Head: Normocephalic and atraumatic.     Mouth/Throat:     Pharynx: No oropharyngeal exudate.  Eyes:     General: No scleral icterus.       Right eye: No discharge.        Left eye: No discharge.     Conjunctiva/sclera: Conjunctivae normal.     Pupils: Pupils are equal, round, and reactive to light.  Neck:     Musculoskeletal: Normal range of motion and neck supple.     Thyroid: No thyromegaly.  Cardiovascular:     Rate and Rhythm: Normal rate and regular rhythm.     Heart sounds: Normal heart sounds. No murmur. No friction rub. No gallop.   Pulmonary:     Effort: Pulmonary effort is normal. No respiratory distress.     Breath sounds: No stridor. Wheezing (end expiratory wheezes bilaterally) present. No rales.  Abdominal:     General: Bowel sounds are normal. There is no distension.     Palpations: Abdomen is soft.     Tenderness: There is no abdominal tenderness. There is no guarding or rebound.  Lymphadenopathy:     Cervical: No cervical adenopathy.  Skin:    General: Skin is warm and dry.     Coloration: Skin is not pale.     Findings: No rash.  Neurological:     Mental Status: He is alert.     Coordination: Coordination normal.      ED Treatments / Results  Labs (all labs ordered are listed, but only abnormal results are displayed) Labs Reviewed - No data to display  EKG None  Radiology Dg Chest 2 View  Result Date: 10/08/2018 CLINICAL DATA:  [facility], cough w/ sob x 3 days, ? Pneumonia EXAM: CHEST - 2 VIEW COMPARISON:  11/23/2004 FINDINGS: Cardiac silhouette is normal in size. No mediastinal or hilar masses. No evidence of adenopathy. Clear lungs.  No pleural effusion or pneumothorax. Skeletal structures are intact. There changes  from an anterior cervical spine fusion new since the prior chest radiographs IMPRESSION: No active cardiopulmonary disease. Electronically Signed   By: Amie Portland M.D.   On: 10/08/2018 20:32    Procedures Procedures (including critical care time)  Medications Ordered in ED Medications  albuterol (PROVENTIL HFA;VENTOLIN HFA) 108 (90 Base) MCG/ACT inhaler 2 puff (2 puffs Inhalation Given 10/08/18 2144)     Initial Impression / Assessment and Plan / ED Course  I have reviewed the  triage vital signs and the nursing notes.  Pertinent labs & imaging results that were available during my care of the patient were reviewed by me and considered in my medical decision making (see chart for details).     Patient presenting with influenza a diagnosis.  He reports he has had some wheezing.  This was resolved mostly with albuterol inhaler.  He is feeling better after this treatment.  Chest x-ray is clear.  Patient has chronically low blood pressure despite continuing blood pressure medications.  Patient denies any lightheadedness.  He is not tachycardic.  He is very well-appearing.  He is not septic appearing.  His oxygen saturations are between 93 and 96% on room air.  He is feeling well and would like to go home.  Patient advised to stop taking his blood pressure medications while he is sick and to discuss with his doctor about maybe cutting back on 1 of them after considering reports of low blood pressures for the past couple years.  Will discharge home with Tamiflu.  Patient would like to continue taking DayQuil and NyQuil.  Strict return precautions given including feelings of lightheadedness and passing out, shortness of breath, or any other worsening symptoms.  Patient and his wife understand and agree with plan.  I advised recheck by his PCP in 2 days.  Patient discharged in satisfactory condition. I discussed patient case with Dr. Juleen ChinaKohut who guided the patient's management and agrees with  plan.   Final Clinical Impressions(s) / ED Diagnoses   Final diagnoses:  Influenza    ED Discharge Orders         Ordered    oseltamivir (TAMIFLU) 75 MG capsule  Every 12 hours     10/08/18 2251           Emi HolesLaw, Woodson Macha M, PA-C 10/09/18 0009    Raeford RazorKohut, Stephen, MD 10/14/18 [phone]

## 2018-10-08 NOTE — ED Notes (Signed)
Patient left at this time with all belongings. 

## 2018-10-08 NOTE — ED Triage Notes (Signed)
[facility]. He was told to come to the ED to r/o pneumonia.

## 2018-10-08 NOTE — Discharge Instructions (Addendum)
Take Tamiflu as prescribed until completed.  Stop taking your blood pressure medications at the moment, especially while you are sick.  Make sure to stay well-hydrated.  You can take DayQuil and NyQuil as prescribed over-the-counter, as needed for your cough.  You can alternate these medications with ibuprofen for fever.  Do not combine Tylenol when taking DayQuil and NyQuil as it has Tylenol in it.  Please see your doctor in 2 to 3 days for recheck.  Please return to the emergency department immediately if you develop any lightheadedness, passing out, shortness of breath, chest pain, or any other concerning symptoms.

## 2019-03-10 IMAGING — DX DG CHEST 2V
2 series · 2 of 2 positions shown · non-contrast
Comparison: 11/23/2004

CLINICAL DATA: [facility], cough w/ sob x 3
days, ? Pneumonia

EXAM:
CHEST - 2 VIEW

[chest pa]
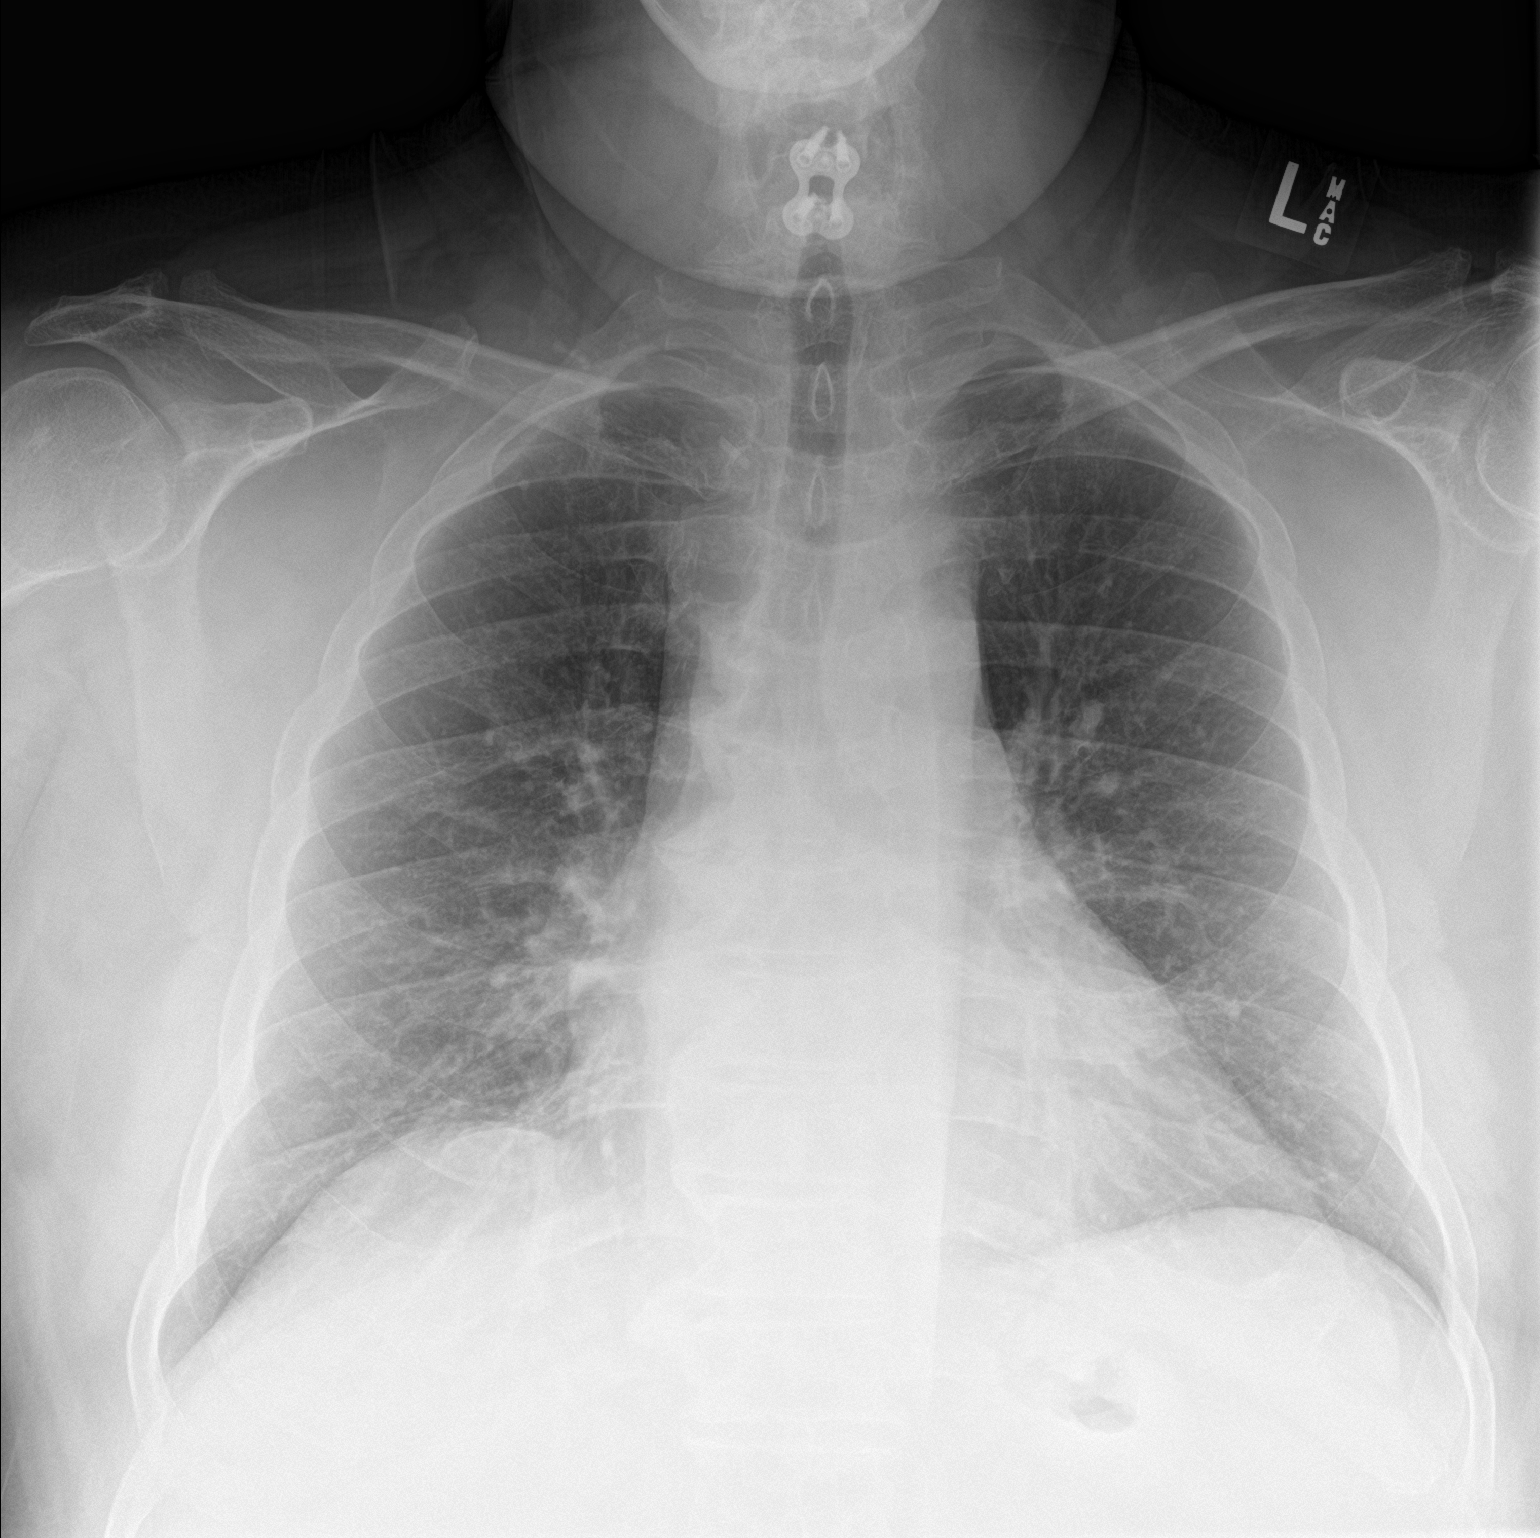

[chest lat]
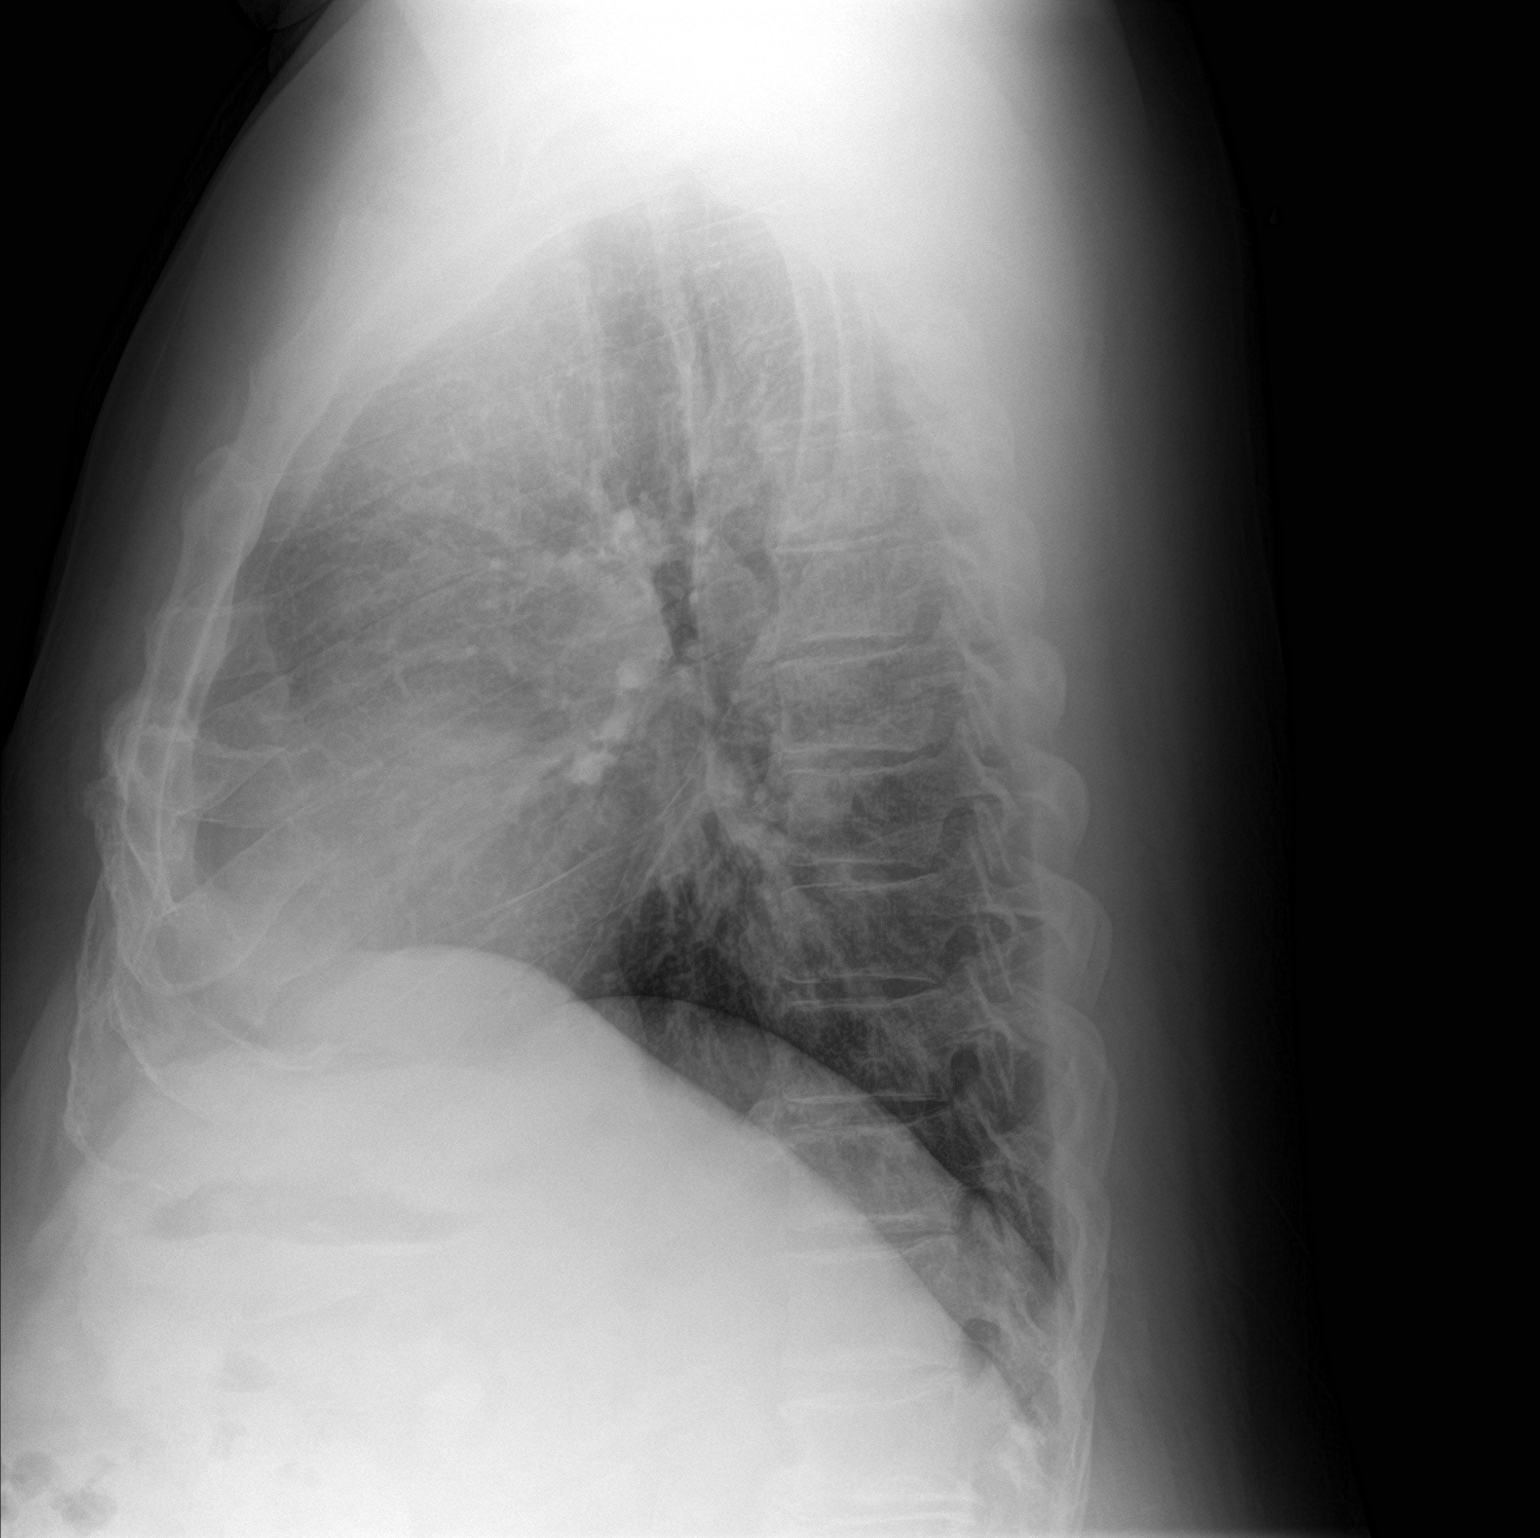

[2 of 2 positions shown; findings below may reference images not displayed]

FINDINGS: Cardiac silhouette is normal in size. No mediastinal or hilar
masses. No evidence of adenopathy.

Clear lungs.  No pleural effusion or pneumothorax.

Skeletal structures are intact. There changes from an anterior
cervical spine fusion new since the prior chest radiographs
IMPRESSION: No active cardiopulmonary disease.

## 2022-02-27 ENCOUNTER — Ambulatory Visit: Payer: 59 | Admitting: Neurology

## 2022-02-27 ENCOUNTER — Encounter: Payer: Self-pay | Admitting: Neurology

## 2022-02-27 VITALS — BP 108/58 | HR 93 | Ht 66.0 in | Wt 260.0 lb

## 2022-02-27 DIAGNOSIS — M5416 Radiculopathy, lumbar region: Secondary | ICD-10-CM | POA: Diagnosis not present

## 2022-02-27 DIAGNOSIS — R269 Unspecified abnormalities of gait and mobility: Secondary | ICD-10-CM

## 2022-02-27 NOTE — Progress Notes (Signed)
Chief Complaint  Patient presents with   New Patient (Initial Visit)    Room 14, alone Joycelyn Rua MD [place] at [facility] [phone]/[id number] neuropathy -DOT Cert  Here for DOT clearace, stating he has feeling in both feet to drive       ASSESSMENT AND PLAN  Stephen Moyer is a 61 y.o. 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  Laboratory evaluation from [facility], reported A1c was 7.5 in June 23, December 22 A1c 7.3, triglyceride 252, LDL 51, CMP showed normal creatinine 1.15, normal TSH 1.58   MEDICAL HISTORY:  Philis Pique, seen in request by   Joycelyn Rua, MD  I reviewed and summarized the referring note.PMHX. Multiple lumbar decompression surgery. DM    4 surgery Dr. 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, DOT Card,   Was put on Lyrica, could not tell difference, Did not do it in Dc 2022  Use bathroom,   Pain at to p fo his feet, numbess at toes,     Past Medical History:  Diagnosis Date   Degenerative disk disease    Diabetes mellitus without complication (HCC)    Hypercholesteremia     Hypertension    Kidney stone    Ruptured lumbar disc    Past Surgical History:  Procedure Laterality Date   BACK SURGERY     NECK SURGERY       PHYSICAL EXAM:   Vitals:   02/27/22 0911  BP: (!) 108/58  Pulse: 93  Weight: 260 lb (117.9 kg)  Height: 5\' 6"  (1.676 m)   Not recorded     Body mass index is 41.97 kg/m.  PHYSICAL EXAMNIATION:  Gen: NAD, conversant, well nourised, well groomed                     Cardiovascular: Regular rate rhythm, no peripheral edema, warm, nontender. Eyes: Conjunctivae clear without exudates or hemorrhage Neck: Supple, no carotid bruits. Pulmonary: Clear to auscultation bilaterally   NEUROLOGICAL EXAM:  MENTAL STATUS: Speech/cognition: Awake, alert, oriented to history taking and casual conversation CRANIAL NERVES: CN II: Visual fields are full to confrontation. Pupils are round equal and briskly reactive to light. CN III, IV, VI: extraocular movement are normal. No ptosis. CN V: Facial sensation is intact to light touch CN VII: Face is symmetric with normal eye closure  CN VIII: Hearing is normal to causal conversation. CN IX, X: Phonation is normal. CN XI: Head turning and shoulder shrug are intact CNXII: Narrow oropharyngeal space  MOTOR: Upper extremity motor strength is normal,  LE Hip Flexion Knee flexion Knee extension Ankle Dorsiflexion Eversion Ankle plantar Flexion Inversion  R 5- 5 5 2 3 4 4   L 5 5  5 4 4  5- 5-     REFLEXES: Reflexes are hypoactive and symmetric at the biceps, triceps, absent at knees, and ankles. Plantar responses are flexor.  SENSORY: Intact to pinprick, two-point pinprick, dull and sharp discrimination, vibratory sensation, proprioception at right toe.  COORDINATION: There is no trunk or limb dysmetria noted.  GAIT/STANCE: He needs push-up to get up from seated position, profound right foot drop, unsteady, is not able to stand up on heels, able to tiptoe,  REVIEW OF SYSTEMS:  Full 14 system  review of systems performed and notable only for as above All other review of systems were negative.   ALLERGIES: No Known Allergies  HOME MEDICATIONS: Current Outpatient Medications  Medication Sig Dispense Refill   benazepril (LOTENSIN) 40 MG tablet Take 40 mg by mouth daily.     glipiZIDE (GLUCOTROL) 10 MG tablet Take 10 mg by mouth daily.     ONE TOUCH ULTRA TEST test strip      pioglitazone (ACTOS) 45 MG tablet Take 45 mg by mouth daily.     simvastatin (ZOCOR) 80 MG tablet Take 80 mg by mouth at bedtime.     SYNJARDY 12.01-999 MG TABS Take 1 tablet by mouth 2 (two) times daily.     No current facility-administered medications for this visit.    PAST MEDICAL HISTORY: Past Medical History:  Diagnosis Date   Degenerative disk disease    Diabetes mellitus without complication (HCC)    Hypercholesteremia    Hypertension    Kidney stone    Ruptured lumbar disc     PAST SURGICAL HISTORY: Past Surgical History:  Procedure Laterality Date   BACK SURGERY     NECK SURGERY      FAMILY HISTORY: History reviewed. No pertinent family history.  SOCIAL HISTORY: Social History   Socioeconomic History   Marital status: Married    Spouse name: Dorothy   Number of children: 2   Years of education: 12   Highest education level: Not on file  Occupational History   Occupation: 09-20-1990     Comment: part time   Tobacco Use   Smoking status: Never   Smokeless tobacco: Never  Substance and Sexual Activity   Alcohol use: No   Drug use: No   Sexual activity: Not on file  Other Topics Concern   Not on file  Social History Narrative   Patient walks daily.    Patient is employed as a part Engineer, [employer].    Patient has a high school education.   Patient been married since 31.   Patient has 2 children, 1 son 1 daughter.          Social Determinants of Health   Financial Resource Strain: Not on file  Food Insecurity: Not on file  Transportation Needs:  Not on file  Physical Activity: Not on file  Stress: Not on file  Social Connections: Not on file  Intimate Partner Violence: Not on file      20, M.D. Ph.D.  [facility] [address], [address] [place], [place] Ph: [phone] Fax: [phone]  CC:  [phone], MD [address] [place],  [place]  [id number], MD

## 2022-10-24 ENCOUNTER — Telehealth: Payer: Self-pay

## 2022-10-24 NOTE — Telephone Encounter (Signed)
Order placed on MD's desk for review and signature.

## 2022-10-24 NOTE — Telephone Encounter (Signed)
Hinton Dyer from [facility] called regarding patient. States he is needing a new shoe and caliber plate ordered for AFO. I spoke with the patient to get some more information. He is needing a prescription placed for the R foot and sent over to [facility]. They have the shoe and just need the information for the plate and strap.

## 2022-11-01 NOTE — Telephone Encounter (Signed)
Pt called wanting to know what the update is on this because he called [facility] and they informed him that they have not received anything from our office.

## 2022-11-02 NOTE — Telephone Encounter (Signed)
I have faxed completed form to [employer] and received confirmation it was received

## 2023-05-09 ENCOUNTER — Telehealth: Payer: Self-pay | Admitting: Neurology

## 2023-05-09 NOTE — Telephone Encounter (Signed)
Called and patient had no additional questions, just requesting a new caliber for shoe. Will complete order request and placed in MD office for signature

## 2023-05-09 NOTE — Telephone Encounter (Signed)
Pt requesting referral sent to [facility] to put caliber in new shoe. Would like a call from the nurse.

## 2023-05-17 NOTE — Telephone Encounter (Signed)
[facility] orders faxed.

## 2023-05-23 NOTE — Telephone Encounter (Signed)
Pt reports that [facility] is informing him that they have not received anything from Dr Terrace Arabia regarding an order for a caliber, please call.

## 2023-05-24 NOTE — Telephone Encounter (Signed)
Called pt and informed him of this information and he stated that he has been called by [facility] and will be taking the shoe to them this afternoon.

## 2023-05-24 NOTE — Telephone Encounter (Signed)
[facility] order refaxed this morning.

## 2024-03-17 ENCOUNTER — Telehealth: Payer: Self-pay | Admitting: Neurology

## 2024-03-17 NOTE — Telephone Encounter (Signed)
 Pt called requesting for the yearly Rx for his foot orthotic to be faxed to [facility] for him. Please advise.

## 2024-03-18 ENCOUNTER — Telehealth: Payer: Self-pay | Admitting: Neurology

## 2024-03-18 NOTE — Telephone Encounter (Signed)
 April called the [facility] and advised them that we need them to send us  the od rx so we can copy that to a new form to send in.   Phone room: please call and schedule pt appointment as they have not been seen in 2 years and in order to continue getting the shoe prescribed by Doctor yan they need tp be seen once yearly to stay in good standing.

## 2024-03-18 NOTE — Telephone Encounter (Signed)
 Call to patient, no answer. Left message to call back. [facility] form in pod for yan signature upon return

## 2024-03-18 NOTE — Telephone Encounter (Signed)
 Pt called returning Phone call to Schedule appt . Gave Pt first available a ppt the patient states he will have to call back . Due to school starting back . Pt need appt  after 3:30 pm

## 2024-03-19 ENCOUNTER — Telehealth: Payer: Self-pay

## 2024-03-19 NOTE — Telephone Encounter (Signed)
 error

## 2024-03-20 ENCOUNTER — Encounter: Payer: Self-pay | Admitting: Neurology

## 2024-03-20 ENCOUNTER — Telehealth: Payer: Self-pay | Admitting: Neurology

## 2024-03-20 NOTE — Telephone Encounter (Signed)
 Called Pt Back , Schedule appt  for Dec 1, however Pt requested if anything open between  12/23-1/02 Please to call.

## 2024-03-20 NOTE — Telephone Encounter (Signed)
 Pt call to cancel appt . Pt job schedule will will not work out for Pt . Pt would like to be schedule around school schedule  . Pt is requesting to  be reschedule  during  Christmas break   between Dec 23- Jan 2 if possible

## 2024-03-27 ENCOUNTER — Ambulatory Visit: Admitting: Neurology

## 2024-04-17 ENCOUNTER — Telehealth: Payer: Self-pay

## 2024-04-17 NOTE — Telephone Encounter (Signed)
 Faxed [facility] orders

## 2024-08-11 ENCOUNTER — Ambulatory Visit: Admitting: Neurology

## 2024-12-15 ENCOUNTER — Ambulatory Visit: Admitting: Neurology
